# Patient Record
Sex: Female | Born: 1965 | Race: White | Hispanic: No | Marital: Married | Smoking: Never smoker
Health system: Southern US, Community
[De-identification: ages and names within clinical notes are randomized; demographics above are authoritative.]

## PROBLEM LIST (undated history)

## (undated) DIAGNOSIS — M797 Fibromyalgia: Secondary | ICD-10-CM

## (undated) DIAGNOSIS — A6920 Lyme disease, unspecified: Secondary | ICD-10-CM

## (undated) DIAGNOSIS — L719 Rosacea, unspecified: Secondary | ICD-10-CM

## (undated) DIAGNOSIS — E039 Hypothyroidism, unspecified: Secondary | ICD-10-CM

## (undated) DIAGNOSIS — Z77120 Contact with and (suspected) exposure to mold (toxic): Secondary | ICD-10-CM

## (undated) DIAGNOSIS — C4491 Basal cell carcinoma of skin, unspecified: Secondary | ICD-10-CM

## (undated) DIAGNOSIS — C801 Malignant (primary) neoplasm, unspecified: Secondary | ICD-10-CM

## (undated) DIAGNOSIS — M810 Age-related osteoporosis without current pathological fracture: Secondary | ICD-10-CM

## (undated) DIAGNOSIS — E739 Lactose intolerance, unspecified: Secondary | ICD-10-CM

## (undated) HISTORY — DX: Contact with and (suspected) exposure to mold (toxic): Z77.120

## (undated) HISTORY — DX: Hypothyroidism, unspecified: E03.9

## (undated) HISTORY — PX: BREAST SURGERY: SHX581

## (undated) HISTORY — PX: MOHS SURGERY: SUR867

## (undated) HISTORY — DX: Basal cell carcinoma of skin, unspecified: C44.91

## (undated) HISTORY — DX: Age-related osteoporosis without current pathological fracture: M81.0

## (undated) HISTORY — DX: Rosacea, unspecified: L71.9

## (undated) HISTORY — DX: Malignant (primary) neoplasm, unspecified: C80.1

## (undated) HISTORY — DX: Lactose intolerance, unspecified: E73.9

## (undated) HISTORY — DX: Lyme disease, unspecified: A69.20

## (undated) HISTORY — DX: Fibromyalgia: M79.7

---

## 2000-05-10 ENCOUNTER — Inpatient Hospital Stay (HOSPITAL_COMMUNITY): Admission: AD | Admit: 2000-05-10 | Discharge: 2000-05-10 | Payer: Self-pay | Admitting: Obstetrics & Gynecology

## 2001-12-10 ENCOUNTER — Ambulatory Visit (HOSPITAL_COMMUNITY): Admission: RE | Admit: 2001-12-10 | Discharge: 2001-12-10 | Payer: Self-pay | Admitting: Gastroenterology

## 2003-11-15 ENCOUNTER — Ambulatory Visit (HOSPITAL_COMMUNITY): Admission: RE | Admit: 2003-11-15 | Discharge: 2003-11-15 | Payer: Self-pay | Admitting: Family Medicine

## 2003-12-24 ENCOUNTER — Ambulatory Visit (HOSPITAL_COMMUNITY): Admission: RE | Admit: 2003-12-24 | Discharge: 2003-12-24 | Payer: Self-pay | Admitting: Family Medicine

## 2007-08-07 HISTORY — PX: BASAL CELL CARCINOMA EXCISION: SHX1214

## 2008-01-28 ENCOUNTER — Other Ambulatory Visit: Admission: RE | Admit: 2008-01-28 | Discharge: 2008-01-28 | Payer: Self-pay | Admitting: Obstetrics and Gynecology

## 2010-12-22 NOTE — Procedures (Signed)
[place]. [facility]  Patient:    STEPHENY, CANAL Visit Number: [id number] MRN: [id number]          Service Type: END Location: ENDO Attending Physician:  Charna Elizabeth Dictated by:   Anselmo Rod, M.D. Proc. Date: 12/11/01 Admit Date:  12/10/2001 Discharge Date: 12/10/2001   CC:         Marcial Pacas E. Earlene Plater, M.D.   Procedure Report  DATE OF BIRTH:  [birth year]  REFERRING PHYSICIAN:  Sheppard Plumber. Earlene Plater, M.D.  PROCEDURE PERFORMED:  Esophagogastroduodenoscopy.  ENDOSCOPIST:  Anselmo Rod, M.D.  INSTRUMENT USED:  Olympus video panendoscope.  INDICATIONS FOR PROCEDURE:  Dysphagia in a 45 year old white female, rule out esophageal stricture, esophagitis, etc.  PREPROCEDURE PREPARATION:  Informed consent was procured from the patient. The patient was fasted for eight hours prior to the procedure.  PREPROCEDURE PHYSICAL:  The patient had stable vital signs.  Neck supple. Chest clear to auscultation.  S1, S2 regular.  Abdomen soft with normal abdominal bowel sounds.  DESCRIPTION OF PROCEDURE:  The patient was placed in left lateral decubitus position and sedated with 100 mg of Demerol and 10 mg of Versed intravenously. Once the patient was adequately sedated and maintained on low-flow oxygen and continuous cardiac monitoring, the Olympus video panendoscope was advanced through the mouthpiece, over the tongue, into the esophagus under direct vision.  There were small patchy whitish areas in the distal esophagus close to the Z-line.  These were brushed for Candida.  The rest of the esophagus appeared normal in the proximal portion of the midesophagus.  The entire gastric mucosa and proximal small bowel appeared normal.  There was a small hiatal hernia seen on high retroflexion.  IMPRESSION: 1. Mild patchy erythematous changes in the distal esophagus. 2. Small hiatal hernia. 3. Normal-appearing gastric mucosa in the proximal small  bowel.  RECOMMENDATION: 1. Await brushing results. 2. Proceed with a colonoscopy at this time.  Further recommendation made    thereafter. Dictated by:   Anselmo Rod, M.D. Attending Physician:  Charna Elizabeth DD:  12/11/01 TD:  12/12/01 Job: [id number]

## 2010-12-22 NOTE — Procedures (Signed)
[place]. [facility]  Patient:    Stephanie Foley, Stephanie Foley Visit Number: [id number] MRN: [id number]          Service Type: END Location: ENDO Attending Physician:  Charna Elizabeth Dictated by:   Anselmo Rod, M.D. Proc. Date: 12/11/01 Admit Date:  12/10/2001 Discharge Date: 12/10/2001   CC:         Marcial Pacas E. Earlene Plater, M.D.   Procedure Report  DATE OF BIRTH:  [birth year]  REFERRING PHYSICIAN:  Sheppard Plumber. Earlene Plater, M.D.  PROCEDURE PERFORMED:  Colonoscopy.  ENDOSCOPIST:  Anselmo Rod, M.D.  INSTRUMENT USED:  Olympus pediatric colonoscope.  INDICATIONS FOR PROCEDURE:  Rectal bleeding and a history of anal fissures in a 45 year old white female who is postpartum.  The patient has been on stool softeners, Diltiazem cream and lidocaine for symptomatic relief since February of this year but continues to have rectal pain and rectal bleeding. The procedure is being done to rule out colitis, polyps, etc.  PREPROCEDURE PREPARATION:  Informed consent was procured from the patient. The patient was fasted for eight hours prior to the procedure and prepped with a bottle of magnesium citrate and a gallon of NuLytely the night prior to the procedure.  PREPROCEDURE PHYSICAL:  The patient had stable vital signs.  Neck supple. Chest clear to auscultation.  S1, S2 regular.  Abdomen soft with normal bowel sounds.  DESCRIPTION OF PROCEDURE:  The patient was placed in the left lateral decubitus position and sedated with an additional 20 mg of Demerol and 2 mg of Versed for the colonoscopy.  Once the patient was adequately sedated and maintained on low-flow oxygen and continuous cardiac monitoring, the Olympus video colonoscope was advanced from the rectum to the cecum and terminal ileum without difficulty.  Two small superficial anal fissures were seen on anal inspection.  The rest of the exam up to the terminal ileum was normal.  IMPRESSION: 1. Normal colonoscopy  including normal terminal ileum. 2. Two small superficial anal fissures seen.  RECOMMENDATIONS: 1. Continue Diltiazem cream along with lidocaine gel at 10%.  Lidocaine    gel has been called in to her pharmacy. 2. Sitz baths. 3. High fiber diet has been discussed with her in great detail. 4. Breast feeding is to be held for the next 48 hours. Dictated by:   Anselmo Rod, M.D. Attending Physician:  Charna Elizabeth DD:  12/11/01 TD:  12/12/01 Job: [id number]

## 2013-01-26 ENCOUNTER — Telehealth: Payer: Self-pay | Admitting: Certified Nurse Midwife

## 2013-01-26 NOTE — Telephone Encounter (Signed)
Spoke with pt about DL advice. Pt is planning to see PCP on Wed for a checkup. Pt wondering if she can have them draw blood to check thyroid while she is there and have them fax results to our office?

## 2013-01-26 NOTE — Telephone Encounter (Signed)
Spoke with pt about iron supplement. Advised pt she should try a slow release iron that might be easier on her system. Also pt could try an OTC stool softener like colace. Advised pt to increase her consumption of iron rich foods like spinach and other dark green leafy veggies. Fruits and veggies always help constipation. Pt not sure which kind of iron she takes, but she will look into it. Pt also asking for a good website recommendation from DL about perimenopause that she can refer to. Please advise.

## 2013-01-26 NOTE — Telephone Encounter (Signed)
It may not be low iron that is the problem, sometimes Thyroid changes will mimic perimenopausal changes.  I think she needs OV

## 2013-06-23 ENCOUNTER — Encounter: Payer: Self-pay | Admitting: Certified Nurse Midwife

## 2013-06-24 ENCOUNTER — Telehealth: Payer: Self-pay | Admitting: Certified Nurse Midwife

## 2013-06-24 MED ORDER — VITAMIN D (ERGOCALCIFEROL) 1.25 MG (50000 UNIT) PO CAPS: 50000.0000 [IU] | ORAL_CAPSULE | ORAL | Status: DC

## 2013-06-24 NOTE — Telephone Encounter (Signed)
Refill request for VITAMIN D  Last filled by MD on - 06/24/12 X 1 YEAR Last AEX - 06/24/12 Next AEX - 07/27/13 Last Vitamin D - 06/24/12 = 42 RX sent until AEX.

## 2013-06-24 NOTE — Telephone Encounter (Signed)
Patient needs a refill of Vitamin D prescription Pharmacy [facility] [phone] Had to reschedule her AEX due to work

## 2013-06-25 ENCOUNTER — Ambulatory Visit: Payer: Self-pay | Admitting: Certified Nurse Midwife

## 2013-07-27 ENCOUNTER — Ambulatory Visit (INDEPENDENT_AMBULATORY_CARE_PROVIDER_SITE_OTHER): Payer: BC Managed Care – PPO | Admitting: Certified Nurse Midwife

## 2013-07-27 ENCOUNTER — Encounter: Payer: Self-pay | Admitting: Certified Nurse Midwife

## 2013-07-27 VITALS — BP 110/72 | HR 68 | Resp 16 | Ht 62.75 in | Wt 124.0 lb

## 2013-07-27 DIAGNOSIS — Z01419 Encounter for gynecological examination (general) (routine) without abnormal findings: Secondary | ICD-10-CM

## 2013-07-27 NOTE — Progress Notes (Signed)
47 y.o. [id number] Married Caucasian Fe here for annual exam.  Periods normal, but 28-32 days, no issues. Sees PCP for aex and labs. Plans mammogram in 2015, she promises! Just got back from camping in the [place] with children. No health issues today.  Patient's last menstrual period was 07/06/2013.          Sexually active: yes  The current method of family planning is condoms most of the time.    Exercising: yes  gym,weights & aerobics Smoker:  no  Health Maintenance: Pap:  06-24-12 neg HPV HR neg MMG:  none Colonoscopy:  2003 BMD:   none TDaP:  Unsure of date, but updated Labs: none Self breast exam: done occ   reports that she has never smoked. She does not have any smokeless tobacco history on file. She reports that she does not drink alcohol or use illicit drugs.  Past Medical History  Diagnosis Date  . Rosacea   . Cancer     basal cell, different areas of body  . Fibromyalgia     Past Surgical History  Procedure Laterality Date  . Basal cell carcinoma excision  2009    times 5  . Mohs surgery      for basal cell (face)  . Breast surgery  1999 or 2000    mastitis    Current Outpatient Prescriptions  Medication Sig Dispense Refill  . Ascorbic Acid (VITAMIN C PO) Take by mouth as needed.      . B Complex Vitamins (B COMPLEX PO) Take by mouth daily.      Marland Kitchen CALCIUM PO Take by mouth 2 (two) times daily.      . cetirizine (ZYRTEC) 10 MG tablet Take 10 mg by mouth daily.      . fluticasone (FLONASE) 50 MCG/ACT nasal spray as needed.      . Multiple Vitamins-Minerals (MULTIVITAMIN PO) Take by mouth daily.      . Vitamin D, Ergocalciferol, (DRISDOL) 50000 UNITS CAPS capsule Take 1 capsule (50,000 Units total) by mouth every 7 (seven) days.  12 capsule  0   No current facility-administered medications for this visit.    Family History  Problem Relation Age of Onset  . Hypertension Mother   . Hypertension Father   . Stroke Father   . Hypertension Brother      ROS:  Pertinent items are noted in HPI.  Otherwise, a comprehensive ROS was negative.  Exam:   BP 110/72  Pulse 68  Resp 16  Ht 5' 2.75" (1.594 m)  Wt 124 lb (56.246 kg)  BMI 22.14 kg/m2  LMP 07/06/2013 Height: 5' 2.75" (159.4 cm)  Ht Readings from Last 3 Encounters:  07/27/13 5' 2.75" (1.594 m)    General appearance: alert, cooperative and appears stated age Head: Normocephalic, without obvious abnormality, atraumatic Neck: no adenopathy, supple, symmetrical, trachea midline and thyroid normal to inspection and palpation and non-palpable Lungs: clear to auscultation bilaterally Breasts: normal appearance, no masses or tenderness, No nipple retraction or dimpling, No nipple discharge or bleeding, No axillary or supraclavicular adenopathy Heart: regular rate and rhythm Abdomen: soft, non-tender; no masses,  no organomegaly Extremities: extremities normal, atraumatic, no cyanosis or edema Skin: Skin color, texture, turgor normal. No rashes or lesions Lymph nodes: Cervical, supraclavicular, and axillary nodes normal. No abnormal inguinal nodes palpated Neurologic: Grossly normal   Pelvic: External genitalia:  no lesions              Urethra:  normal appearing urethra  with no masses, tenderness or lesions              Bartholin's and Skene's: normal                 Vagina: normal appearing vagina with normal color and discharge, no lesions              Cervix: normal non tender              Pap taken: no Bimanual Exam:  Uterus:  normal size, contour, position, consistency, mobility, non-tender and mid position              Adnexa: normal adnexa and no mass, fullness, tenderness               Rectovaginal: Confirms               Anus:  normal sphincter tone, no lesions  A:  Well Woman with normal exam  Contraception condoms  P:   Reviewed health and wellness pertinent to exam  Pap smear as per guidelines   Mammogram stressed importance and stressed having done in  2015 pap smear not taken today  counseled on breast self exam, mammography screening, adequate intake of calcium and vitamin D, diet and exercise return annually or prn   An After Visit Summary was printed and given to the patient.

## 2013-07-27 NOTE — Patient Instructions (Signed)

## 2013-07-29 NOTE — Progress Notes (Signed)
Reviewed personally.  M. Suzanne Shneur [place], MD.  

## 2013-10-12 ENCOUNTER — Other Ambulatory Visit: Payer: Self-pay | Admitting: Certified Nurse Midwife

## 2013-10-12 NOTE — Telephone Encounter (Signed)
Last AEX 07/27/13 Last refill 06/24/2013 #12/0 refills Next appt 08/11/2014 Last Vitamin D - 06/24/12 = 42  Please approve or deny rx.

## 2013-10-26 ENCOUNTER — Telehealth: Payer: Self-pay | Admitting: Certified Nurse Midwife

## 2013-10-26 ENCOUNTER — Other Ambulatory Visit: Payer: Self-pay | Admitting: Orthopedic Surgery

## 2013-10-26 NOTE — Telephone Encounter (Signed)
Once every 2 weeks

## 2013-10-26 NOTE — Telephone Encounter (Signed)
Spoke with patient. Patient states she has been taking Vitamin D once a week due to hair loss. Prescription now reads to take once every two weeks. Debbi would you like her to continue with once every week or every two weeks?

## 2013-10-26 NOTE — Telephone Encounter (Signed)
Patient is calling about vitamin d said that the last thing  debbi told her was to take the vitamin d once a week but it is listed at once every 14 days. Can you please advise? If it is once a week rx needs to be corrected

## 2013-10-26 NOTE — Telephone Encounter (Signed)
LMTCB.   (Most recent Rx for Vit D was for every 14 days, as of 10-12-13 by DL. No more recent phone note instructing otherwise.)

## 2013-10-27 NOTE — Telephone Encounter (Signed)
LMTCB

## 2013-10-27 NOTE — Telephone Encounter (Signed)
Spoke with pt to advise her to take Vit D once every 2 weeks. Pt agreeable.

## 2013-11-04 NOTE — Telephone Encounter (Signed)
Patient is asking to talk with Jackelyn Poling directly regarding her vitamin D. Patient is asking id Jackelyn Poling in unavailable to cal her she will talk to her nurse.

## 2013-11-04 NOTE — Telephone Encounter (Signed)
Unable to reach patient.

## 2013-11-05 NOTE — Telephone Encounter (Signed)
LMTCB

## 2013-12-14 ENCOUNTER — Telehealth: Payer: Self-pay | Admitting: Certified Nurse Midwife

## 2013-12-14 NOTE — Telephone Encounter (Signed)
Patient will need to come in for Vitamin D level assessment before will change. She can try stopping her Rx and taking daily 1000 IU which some patients are doing better with the daily dosage. Advise what she decides.

## 2013-12-14 NOTE — Telephone Encounter (Signed)
Patient says that she thinks that taking th vitamin d every two weeks is still not enough. Says she is still having fibromyalgia symptoms

## 2013-12-14 NOTE — Telephone Encounter (Signed)
Spoke with patient. Patient states that she was placed on Vitamin D a long time ago by Regina Eck CNM. Was taking it weekly due to fibromyalgia symptoms but after third child was changed to every two weeks. Has been going back and forth between weekly to every two weeks for a while. Patient is currently taking Vitamin D every two weeks. Patient states "I am having months of feeling bad. I am hurting all over, I am weak, and I don't feel well after resting. One day I took my kids to school and came home and got into bed with chills and aches. I used three blankets and took advil and tylenol but I was still hurting after two hours." Patient requesting that to increase dose of Vitamin D to every week instead of every two. Advised I would send a message to [place] regarding dosage increase and would give patient a call back with further instructions and advice. Patient agreeable.

## 2013-12-15 NOTE — Telephone Encounter (Signed)
Spoke with patient. Message from [place] given as seen below. Patient would like to try 1000 IU of Vitamin D daily to see how this works for her. If symptoms are not relieved will call back to schedule office visit for Vitamin D level assessment.   Routing to provider for final review. Patient agreeable to disposition. Will close encounter

## 2014-06-07 ENCOUNTER — Encounter: Payer: Self-pay | Admitting: Certified Nurse Midwife

## 2014-07-15 ENCOUNTER — Other Ambulatory Visit: Payer: Self-pay

## 2014-07-15 DIAGNOSIS — Z1231 Encounter for screening mammogram for malignant neoplasm of breast: Secondary | ICD-10-CM

## 2014-08-10 ENCOUNTER — Telehealth: Payer: Self-pay | Admitting: Certified Nurse Midwife

## 2014-08-10 NOTE — Telephone Encounter (Signed)
Left message regarding upcoming appointment has been canceled and needs to be rescheduled. °

## 2014-08-11 ENCOUNTER — Ambulatory Visit: Payer: BC Managed Care – PPO | Admitting: Certified Nurse Midwife

## 2015-03-10 ENCOUNTER — Telehealth: Payer: Self-pay

## 2015-03-10 NOTE — Telephone Encounter (Signed)
Verified patient remaining in MMG recall.  Routing to provider for final review. Patient agreeable to disposition. Will close encounter.

## 2015-03-10 NOTE — Telephone Encounter (Signed)
Remain in recall

## 2015-03-10 NOTE — Telephone Encounter (Signed)
Spoke with patient. Advised we received a letter from [facility] stating they have been unable to reach her regarding scheduling a 6 month follow up mammogram. Patient states "I just did not feel it was needed." Advised this follow up is highly recommended by [facility] and by Regina Eck CNM. Patient is agreeable. Offered to call and schedule follow up appointment for patient but patient declines. Patient states she would like to call and schedule this appointment. Advised if she would like my assistance may return call to office. Patient is agreeable.

## 2015-04-14 ENCOUNTER — Telehealth: Payer: Self-pay | Admitting: *Deleted

## 2015-04-14 NOTE — Telephone Encounter (Signed)
I reviewed the results and due to possibly complicated appearance report recommended 6 month follow up with [place] . Let her know I reviewed the results and this needs to be done.

## 2015-04-14 NOTE — Telephone Encounter (Signed)
Patient is due for 32-month Left MMG and Ultrasound Recall  Last MMG:  07/15/14 with diagnostic left mammogram and ultrasound.  Impression:  Cysts in left breast likely represent complicated cyst and probably benign.  Recommend left mammogram and ultrasound in 6 months.  Pt overdue for mammogram recall.  Due June 2016.  Follow up appointment has not been made per Janett Billow at [place].  Please call patient to schedule.  [place].

## 2015-04-14 NOTE — Telephone Encounter (Signed)
Pt states she was told it was only recommended & that she didn't really have to have a f/u done there. Please advise.

## 2015-04-14 NOTE — Telephone Encounter (Signed)
Pt states she will call & get this follow up done.

## 2015-04-21 ENCOUNTER — Encounter: Payer: Self-pay | Admitting: *Deleted

## 2015-04-21 NOTE — Telephone Encounter (Signed)
Pt has not scheduled follow up breast imaging.  This is verified with [facility] today. Letter created.  Please advise recall.

## 2015-04-21 NOTE — Telephone Encounter (Signed)
OK to send letter and remove from recall. 

## 2015-04-25 NOTE — Telephone Encounter (Signed)
Letter marked as sent and mailed.  Patient removed from current recall.  Closing encounter.

## 2015-05-16 ENCOUNTER — Telehealth: Payer: Self-pay | Admitting: Certified Nurse Midwife

## 2015-05-16 NOTE — Telephone Encounter (Signed)
Patient returned call. She is agreeable to diagnostic imaging of L breast now and then back to screening bilateral breasts in December.  Advised I will contact her when order is sent. Patient agreeable.  Marland Kitchen

## 2015-05-16 NOTE — Telephone Encounter (Signed)
Patient had screening mammogram 07/2014. Needs L Breast Diagnostic mammogram and L Breast Ultrasound, then was in recall for 6 months repeat imaging to be completed in June 2016.   Message left to return call to [place] at [phone].   Patient will need diagnostic L Breast mammogram and L Breast Ultrasound and then screening again in December 2016.

## 2015-05-16 NOTE — Telephone Encounter (Signed)
Patient says that Ms. Debbie wants her to get a mammogram, patient needs to know if it is screening or diagnostic. Patient says she will also need a order faxed to [facility]. Best contact [phone]

## 2015-05-18 NOTE — Telephone Encounter (Signed)
Spoke with patient and advised order sent. She can call at her [employer]  To schedule. Patient agreeable.  Routing to provider for final review. Patient agreeable to disposition. Will close encounter.

## 2016-08-06 DIAGNOSIS — F0781 Postconcussional syndrome: Secondary | ICD-10-CM

## 2016-08-06 HISTORY — DX: Postconcussional syndrome: F07.81

## 2016-09-18 ENCOUNTER — Ambulatory Visit (HOSPITAL_COMMUNITY)
Admission: EM | Admit: 2016-09-18 | Discharge: 2016-09-18 | Disposition: A | Discharge status: Home / Self Care | Payer: Self-pay | Attending: Family Medicine | Admitting: Family Medicine

## 2016-09-18 ENCOUNTER — Encounter (HOSPITAL_COMMUNITY): Payer: Self-pay | Admitting: Emergency Medicine

## 2016-09-18 DIAGNOSIS — M542 Cervicalgia: Secondary | ICD-10-CM

## 2016-09-18 DIAGNOSIS — R079 Chest pain, unspecified: Secondary | ICD-10-CM

## 2016-09-18 MED ORDER — CYCLOBENZAPRINE HCL 5 MG PO TABS: 5.0000 mg | ORAL_TABLET | Freq: Three times a day (TID) | ORAL | 0 refills | Status: DC

## 2016-09-18 MED ORDER — DICLOFENAC POTASSIUM 50 MG PO TABS: 50.0000 mg | ORAL_TABLET | Freq: Three times a day (TID) | ORAL | 0 refills | Status: DC
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
## 2016-09-18 NOTE — Discharge Instructions (Signed)
Wear collar, use heat and medicine as needed, see your doctor if any problems
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
## 2016-09-25 ENCOUNTER — Ambulatory Visit: Payer: No Typology Code available for payment source | Attending: Sports Medicine | Admitting: Physical Therapy

## 2016-09-25 ENCOUNTER — Encounter: Payer: Self-pay | Admitting: Physical Therapy

## 2016-09-25 DIAGNOSIS — M542 Cervicalgia: Secondary | ICD-10-CM | POA: Diagnosis not present

## 2016-09-25 DIAGNOSIS — M6281 Muscle weakness (generalized): Secondary | ICD-10-CM | POA: Insufficient documentation

## 2016-09-25 DIAGNOSIS — M62838 Other muscle spasm: Secondary | ICD-10-CM | POA: Diagnosis present
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
## 2016-09-26 ENCOUNTER — Ambulatory Visit: Payer: No Typology Code available for payment source | Admitting: Physical Therapy

## 2016-09-26 ENCOUNTER — Encounter: Payer: Self-pay | Admitting: Physical Therapy

## 2016-09-26 DIAGNOSIS — M542 Cervicalgia: Secondary | ICD-10-CM

## 2016-09-26 DIAGNOSIS — M62838 Other muscle spasm: Secondary | ICD-10-CM

## 2016-09-26 DIAGNOSIS — M6281 Muscle weakness (generalized): Secondary | ICD-10-CM
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
## 2016-09-27 ENCOUNTER — Ambulatory Visit: Payer: No Typology Code available for payment source | Admitting: Physical Therapy

## 2016-09-27 DIAGNOSIS — M542 Cervicalgia: Secondary | ICD-10-CM

## 2016-09-27 DIAGNOSIS — M6281 Muscle weakness (generalized): Secondary | ICD-10-CM

## 2016-09-27 DIAGNOSIS — M62838 Other muscle spasm: Secondary | ICD-10-CM

## 2016-09-27 NOTE — Patient Instructions (Signed)
St[facility] [address], [address] [place], [place] [address] Phone # [phone] Fax [phone]    
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
## 2016-10-01 ENCOUNTER — Ambulatory Visit: Payer: No Typology Code available for payment source | Admitting: Physical Therapy

## 2016-10-01 ENCOUNTER — Encounter: Payer: Self-pay | Admitting: Physical Therapy

## 2016-10-01 DIAGNOSIS — M542 Cervicalgia: Secondary | ICD-10-CM

## 2016-10-01 DIAGNOSIS — M62838 Other muscle spasm: Secondary | ICD-10-CM

## 2016-10-01 DIAGNOSIS — M6281 Muscle weakness (generalized): Secondary | ICD-10-CM
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
## 2016-10-03 ENCOUNTER — Encounter: Payer: Self-pay | Admitting: Physical Therapy

## 2016-10-03 ENCOUNTER — Ambulatory Visit: Payer: No Typology Code available for payment source | Admitting: Physical Therapy

## 2016-10-03 DIAGNOSIS — M6281 Muscle weakness (generalized): Secondary | ICD-10-CM

## 2016-10-03 DIAGNOSIS — M542 Cervicalgia: Secondary | ICD-10-CM | POA: Diagnosis not present

## 2016-10-03 DIAGNOSIS — M62838 Other muscle spasm: Secondary | ICD-10-CM
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
## 2016-10-05 ENCOUNTER — Ambulatory Visit: Payer: No Typology Code available for payment source | Attending: Sports Medicine | Admitting: Physical Therapy

## 2016-10-05 DIAGNOSIS — M542 Cervicalgia: Secondary | ICD-10-CM | POA: Insufficient documentation

## 2016-10-05 DIAGNOSIS — M6281 Muscle weakness (generalized): Secondary | ICD-10-CM

## 2016-10-05 DIAGNOSIS — M62838 Other muscle spasm: Secondary | ICD-10-CM

## 2016-10-05 NOTE — Patient Instructions (Signed)
   Levator Stretch   Grasp seat or sit on hand on side to be stretched. Turn head toward other side and look down. Use hand on head to gently stretch neck in that position. Hold _20___ seconds. Repeat on other side. Repeat __3__ times. Do __2__ sessions per day.  http://[email or web address].exer.[email or web address]   Copyright  VHI. All rights reserved.  Side-Bending   One hand on opposite side of head, pull head to side as far as is comfortable. Stop if there is pain. Hold _20__ seconds. Repeat with other hand to other side. Repeat ___3_ times. Do ___2_ sessions per day.   Copyright  VHI. All rights reserved.  Scapular Retraction (Standing)   With arms at sides, pinch shoulder blades together. Repeat _10___ times per set. Do __1__ sets per session. Do __2__ sessions per day.  [email or web address] [address], [place], [place] [address] Phone # [phone] Fax [phone]

## 2016-10-05 NOTE — Therapy (Signed)
[name] [address] [name], [name], [address] Phone: [phone]   Fax:  [phone]  Physical Therapy Treatment  Patient Details  Name: Stephanie Foley MRN: [id number] Date of Birth: 1965-09-02 Referring Provider: Dr. Druscilla Brownie  Encounter Date: 10/05/2016      PT End of Session - 10/05/16 1009    Visit Number 6   Number of Visits 20   Date for PT Re-Evaluation 11/20/16   Authorization Type 20 visit limit   PT Start Time 0938   PT Stop Time 1037   PT Time Calculation (min) 59 min   Activity Tolerance Patient tolerated treatment well      Past Medical History:  Diagnosis Date  . Cancer ([place])    basal cell, different areas of body  . Fibromyalgia   . Rosacea     Past Surgical History:  Procedure Laterality Date  . BASAL CELL CARCINOMA EXCISION  2009   times 5  . BREAST SURGERY  1999 or 2000   mastitis  . MOHS SURGERY     for basal cell (face)    There were no vitals filed for this visit.      Subjective Assessment - 10/05/16 0938    Subjective My main concern is the concussion stuff.  Some anterior chest pain which patient attributes to working on computer yesterday.  Had a good soreness after last visit.  My jaw has been better this week.  Shoulders/chest achiness    Currently in Pain? Yes   Pain Score 1    Pain Location Shoulder  chest                         [facility] Adult PT Treatment/Exercise - 10/05/16 0001      Therapeutic Activites    Therapeutic Activities ADL's;Work Simulation   ADL's postural alignment   Work Simulation sitting, standing, walking     Neck Exercises: Standing   Other Standing Exercises against wall for postural cues   Other Standing Exercises scap retraction 10x     Neck Exercises: Supine   Other Supine Exercise foam roll melt method circles, nods 6x   Other Supine Exercise lying on vertical foam roll with palms up and arms overhead 3  min     Neck Exercises: Sidelying   Other Sidelying Exercise foam roll circles right/left     Moist Heat Therapy   Number Minutes Moist Heat 15 Minutes   Moist Heat Location Cervical  extra layers secondary to heat sensitivity on chest     Electrical Stimulation   Electrical Stimulation Location cervical  and anterior shoulder/chest pec region   Electrical Stimulation Action pre-mod   Electrical Stimulation Parameters to tolerance   Electrical Stimulation Goals Pain     Neck Exercises: Stretches   Upper Trapezius Stretch 2 reps;20 seconds   Levator Stretch 2 reps;20 seconds   Corner Stretch 2 reps;20 seconds                PT Education - 10/05/16 0959    Education provided Yes   Education Details neck/chest stretches for postural correction   Person(s) Educated Patient   Methods Explanation;Demonstration;Handout   Comprehension Verbalized understanding;Returned demonstration          PT Short Term Goals - 10/05/16 1032      PT SHORT TERM GOAL #1   Title independent with initial HEP   Status Achieved     PT  SHORT TERM GOAL #2   Title pain with daily activities decreased >/= 25%   Period Weeks   Status On-going     PT SHORT TERM GOAL #3   Title understand correct posture to decrease strain on cervical and Jaw   Time 4   Period Weeks   Status On-going     PT SHORT TERM GOAL #4   Title ability to eat with pain decreased >/= due to decreased muscle tightness   Time 4   Period Weeks   Status On-going     PT SHORT TERM GOAL #5   Title pain is intermittent due to increased tolerance to activity   Time 4   Period Weeks   Status On-going           PT Long Term Goals - 10/05/16 1033      PT LONG TERM GOAL #1   Title independent with HEP   Time 8   Period Weeks   Status On-going     PT LONG TERM GOAL #2   Title cervical pain with daily activities decresaed >/= 75%   Time 8   Period Weeks   Status On-going     PT LONG TERM GOAL #3   Title  able to eat with pain decreased >/= 75% due to reduction in muscle tightness   Time 8   Period Weeks   Status On-going     PT LONG TERM GOAL #4   Title able to return to work due to pain decreased >/= 75% and concussion symptoms are none to minimal   Time 8   Period Weeks   Status On-going     PT LONG TERM GOAL #5   Title fOTO score </= 25% limitation   Time 8   Period Weeks   Status On-going     PT LONG TERM GOAL #6   Title carrying object and books with 75% greater ease due to increased strength and endurance   Time 8   Period Weeks   Status On-going               Plan - 10/05/16 1026    Clinical Impression Statement 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  She asks about stretches she can do during this time and she was instructed in gentle ROM without overpressure as well as postural considerations and change of position.  She reports heaviness in her head/muscular fatigue following treatment session.  Verbal cues for head/ears over shoulders as patient tends to be in head protruded posture particularly in sitting.   Decreased TMJ pain over the last several days.  Good pain relief with e-stim/heat to cervical/chest region.  Patient has not returned to full activity (driving carpool, social outings or full work duties yet.)     PT Next Visit Plan initiate cervical isometrics and/or supine cervical and scapular stabilization exercises;  modalities as needed for pain control;  assess progress toward STGs      Patient will benefit from skilled therapeutic intervention in order to improve the following deficits and impairments:     Visit Diagnosis: Cervicalgia  Other muscle spasm  Muscle weakness (generalized)     Problem List There are no active problems to display for this patient.   [name], PT 10/05/16 10:47 AM Phone: [phone] Fax: [phone]  [name] 10/05/2016, 10:46  AM  [facility] [address], [address]  [place], [name], [address] Phone: [phone]   Fax:  [phone]  Name: Stephanie Foley MRN: [id number] Date of Birth: 11-02-1965

## 2016-10-09 ENCOUNTER — Encounter: Payer: No Typology Code available for payment source | Admitting: Physical Therapy

## 2016-10-10 ENCOUNTER — Encounter: Payer: Self-pay | Admitting: Neurology

## 2016-10-10 ENCOUNTER — Ambulatory Visit (INDEPENDENT_AMBULATORY_CARE_PROVIDER_SITE_OTHER): Payer: No Typology Code available for payment source | Admitting: Neurology

## 2016-10-10 VITALS — BP 118/70 | HR 91 | Ht 63.0 in | Wt 122.4 lb

## 2016-10-10 DIAGNOSIS — F0781 Postconcussional syndrome: Secondary | ICD-10-CM
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
## 2016-10-10 NOTE — Patient Instructions (Signed)
I would like you to limit screen time (including your phone) to 90 minutes daily for next week.   I want you to be active.  Start with walking outside daily donw the street and back and add a little distance daily.  You can start introducing activity slowly and as tolerated.  Try driving, but on local streets to the grocery store (for example).  I would avoid high way driving for now.  You may try yoga as tolerated.   I would ask the physical therapist about any limitations in terms of weights.  In addition to this I recommend......  To help improve COGNITIVE function: Using fish oil/omega 3 that is 1000 mg (or roughly 600 mg EPA/DHA), starting as soon as possible after concussion, take: 3 tabs THREE TIMES a day  for the first 3 days, then (you will smell a little, sory) 3 tabs TWICE DAILY  for the next 3 days, then 3 tabs ONCE DAILY  for the next 10 days    To help reduce HEADACHES: Coenzyme Q10 160mg  ONCE DAILY Riboflavin/Vitamin B2 400mg  ONCE DAILY Magnesium oxide 400mg  ONCE - TWICE DAILY May stop after headaches are resolved.                                                                                               To help with INSOMNIA: Melatonin 3-5mg  AT BEDTIME       Other medicines to help decrease inflammation Alpha Lipoic Acid 100mg  TWICE DAILY Turmeric 500mg  twice daily Iron 65mg  elemental daily Vitamin D 4000 IU daily for 2 weeks then 2000 IU daily thereafter.  Follow up in 4 to 6 weeks.

## 2016-10-11 ENCOUNTER — Ambulatory Visit: Payer: No Typology Code available for payment source | Admitting: Physical Therapy

## 2016-10-11 DIAGNOSIS — M62838 Other muscle spasm: Secondary | ICD-10-CM

## 2016-10-11 DIAGNOSIS — M542 Cervicalgia: Secondary | ICD-10-CM | POA: Diagnosis not present

## 2016-10-11 DIAGNOSIS — M6281 Muscle weakness (generalized): Secondary | ICD-10-CM

## 2016-10-11 NOTE — Therapy (Signed)
[facility] [address] [place], [name], [address] Phone: [phone]   Fax:  [phone]  Physical Therapy Treatment  Patient Details  Name: Stephanie Foley MRN: [id number] Date of Birth: Nov 29, 1965 Referring Provider: Dr. Druscilla Brownie  Encounter Date: 10/11/2016      PT End of Session - 10/11/16 0914    Visit Number 7   Number of Visits 20   Date for PT Re-Evaluation 11/20/16   Authorization Type 20 visit limit   PT Start Time 0847   PT Stop Time 0940   PT Time Calculation (min) 53 min   Activity Tolerance Patient tolerated treatment well      Past Medical History:  Diagnosis Date  . Cancer ([place])    basal cell, different areas of body  . Fibromyalgia   . Rosacea     Past Surgical History:  Procedure Laterality Date  . BASAL CELL CARCINOMA EXCISION  2009   times 5  . BREAST SURGERY  1999 or 2000   mastitis  . MOHS SURGERY     for basal cell (face)    There were no vitals filed for this visit.      Subjective Assessment - 10/11/16 0852    Subjective Ended up working 2 small jobs  this past Monday and the sitting bothers me.    I'm trying to get back to normal life.  Some dizziness over the weekend.  My neck feels achy lower midline.  Less chest soreness.  I saw the neurologist and that was really good.  He told me not to work for 4 more weeks.     Currently in Pain? Yes   Pain Score 2    Pain Location Neck   Pain Type Acute pain   Aggravating Factors  sitting;  stress                         [facility] Adult PT Treatment/Exercise - 10/11/16 0001      Therapeutic Activites    ADL's postural alignment   Work Simulation sitting, standing, walking     Neck Exercises: Seated   Cervical Isometrics Flexion;Extension;Right lateral flexion;Left lateral flexion;Right rotation;Left rotation;5 secs;5 reps     Neck Exercises: Supine   Other Supine Exercise yellow band supine scapular  exercises:  overhead narrow and wide grip, horizontal abduction, sash, external rotation 5x each                PT Education - 10/11/16 0912    Education provided Yes   Education Details neck isometrics;  supine scapular exercises   Person(s) Educated Patient   Methods Explanation;Demonstration;Handout   Comprehension Verbalized understanding;Returned demonstration          PT Short Term Goals - 10/11/16 0917      PT SHORT TERM GOAL #1   Title independent with initial HEP   Status Achieved     PT SHORT TERM GOAL #2   Title pain with daily activities decreased >/= 25%   Status Achieved     PT SHORT TERM GOAL #3   Title understand correct posture to decrease strain on cervical and Jaw   Time 4   Period Weeks   Status On-going     PT SHORT TERM GOAL #4   Title ability to eat with pain decreased >/= due to decreased muscle tightness     PT SHORT TERM GOAL #5   Title pain is intermittent  due to increased tolerance to activity   Status Achieved           PT Long Term Goals - 10/11/16 0934      PT LONG TERM GOAL #1   Title independent with HEP   Time 8   Period Weeks   Status On-going     PT LONG TERM GOAL #2   Title cervical pain with daily activities decresaed >/= 75%   Time 8   Period Weeks   Status On-going     PT LONG TERM GOAL #3   Title able to eat with pain decreased >/= 75% due to reduction in muscle tightness   Period Weeks   Status On-going     PT LONG TERM GOAL #4   Title able to return to work due to pain decreased >/= 75% and concussion symptoms are none to minimal   Time 8   Period Weeks   Status On-going     PT LONG TERM GOAL #5   Title fOTO score </= 25% limitation   Time 8   Period Weeks   Status On-going     PT LONG TERM GOAL #6   Title carrying object and books with 75% greater ease due to increased strength and endurance   Time 8   Period Weeks   Status On-going               Plan - 10/11/16 0915     Clinical Impression Statement The patient's primary complaint is lower central neck pain today.  She reports mild cervical muscular discomfort with exercise but no headache, jaw pain or chest pain.  Back to 35% of usual ADLS.  Hasn't tried grocery shopping.  Driving a little .  Able to eat without pain and ate almonds.  Short term goals met.     PT Next Visit Plan review cervical isometrics as need and supine scapular ex's;  modalities as needed;  manual as needed      Patient will benefit from skilled therapeutic intervention in order to improve the following deficits and impairments:     Visit Diagnosis: Cervicalgia  Other muscle spasm  Muscle weakness (generalized)     Problem List There are no active problems to display for this patient.   [name] 10/11/2016, 9:56 AM  [facility] [address], [place], [name], [address] Phone: [phone]   Fax:  [phone]  Name: Stephanie Foley MRN: [id number] Date of Birth: [birth year]

## 2016-10-11 NOTE — Therapy (Signed)
[facility] [address] [place], [place], [address] Phone: [phone]   Fax:  [phone]  Physical Therapy Treatment  Patient Details  Name: Stephanie Foley MRN: [id number] Date of Birth: 02/25/1966 Referring Provider: Dr. Valetta Fuller  Encounter Date: 10/11/2016      PT End of Session - 10/11/16 0914    Visit Number 7   Number of Visits 20   Date for PT Re-Evaluation 11/20/16   Authorization Type 20 visit limit   PT Start Time 0847   PT Stop Time 0940   PT Time Calculation (min) 53 min   Activity Tolerance Patient tolerated treatment well      Past Medical History:  Diagnosis Date  . Cancer ([facility])    basal cell, different areas of body  . Fibromyalgia   . Rosacea     Past Surgical History:  Procedure Laterality Date  . BASAL CELL CARCINOMA EXCISION  2009   times 5  . BREAST SURGERY  1999 or 2000   mastitis  . MOHS SURGERY     for basal cell (face)    There were no vitals filed for this visit.      Subjective Assessment - 10/11/16 0852    Subjective Ended up working 2 small jobs  this past Monday and the sitting bothers me.    I'm trying to get back to normal life.  Some dizziness over the weekend.  My neck feels achy lower midline.  Less chest soreness.  I saw the neurologist and that was really good.  He told me not to work for 4 more weeks.     Currently in Pain? Yes   Pain Score 2    Pain Location Neck   Pain Type Acute pain   Aggravating Factors  sitting;  stress                         [facility] Adult PT Treatment/Exercise - 10/11/16 0001      Therapeutic Activites    ADL's postural alignment   Work Simulation sitting, standing, walking     Neck Exercises: Seated   Cervical Isometrics Flexion;Extension;Right lateral flexion;Left lateral flexion;Right rotation;Left rotation;5 secs;5 reps     Neck Exercises: Supine   Other Supine Exercise yellow band supine scapular  exercises:  overhead narrow and wide grip, horizontal abduction, sash, external rotation 5x each     Moist Heat Therapy   Number Minutes Moist Heat 15 Minutes   Moist Heat Location Cervical     Electrical Stimulation   Electrical Stimulation Location cervical  and anterior shoulder/chest pec region   Electrical Stimulation Action IFC   Electrical Stimulation Parameters 4 ma    Electrical Stimulation Goals Pain                PT Education - 10/11/16 0912    Education provided Yes   Education Details neck isometrics;  supine scapular exercises   Person(s) Educated Patient   Methods Explanation;Demonstration;Handout   Comprehension Verbalized understanding;Returned demonstration          PT Short Term Goals - 10/11/16 0917      PT SHORT TERM GOAL #1   Title independent with initial HEP   Status Achieved     PT SHORT TERM GOAL #2   Title pain with daily activities decreased >/= 25%   Status Achieved     PT SHORT TERM GOAL #3   Title understand correct  posture to decrease strain on cervical and Jaw   Time 4   Period Weeks   Status On-going     PT SHORT TERM GOAL #4   Title ability to eat with pain decreased >/= due to decreased muscle tightness     PT SHORT TERM GOAL #5   Title pain is intermittent due to increased tolerance to activity   Status Achieved           PT Long Term Goals - 10/11/16 0934      PT LONG TERM GOAL #1   Title independent with HEP   Time 8   Period Weeks   Status On-going     PT LONG TERM GOAL #2   Title cervical pain with daily activities decresaed >/= 75%   Time 8   Period Weeks   Status On-going     PT LONG TERM GOAL #3   Title able to eat with pain decreased >/= 75% due to reduction in muscle tightness   Period Weeks   Status On-going     PT LONG TERM GOAL #4   Title able to return to work due to pain decreased >/= 75% and concussion symptoms are none to minimal   Time 8   Period Weeks   Status On-going     PT  LONG TERM GOAL #5   Title fOTO score </= 25% limitation   Time 8   Period Weeks   Status On-going     PT LONG TERM GOAL #6   Title carrying object and books with 75% greater ease due to increased strength and endurance   Time 8   Period Weeks   Status On-going               Plan - 10/11/16 0915    Clinical Impression Statement The patient's primary complaint is lower central neck pain today.  She reports mild cervical muscular discomfort with exercise but no headache, jaw pain or chest pain.  Back to 35% of usual ADLS.  Hasn't tried grocery shopping.  Driving a little .  Able to eat without pain and ate almonds.  Short term goals met.     PT Next Visit Plan review cervical isometrics as need and supine scapular ex's;  modalities as needed;  manual as needed      Patient will benefit from skilled therapeutic intervention in order to improve the following deficits and impairments:     Visit Diagnosis: Cervicalgia  Other muscle spasm  Muscle weakness (generalized)     Problem List There are no active problems to display for this patient.  [name], PT 10/11/16 10:05 AM Phone: [phone] Fax: [phone]  [name] 10/11/2016, 10:05 AM  [facility] [address] [place], [name], [address] Phone: [phone]   Fax:  [phone]  Name: Stephanie Foley MRN: [id number] Date of Birth: [birth year]

## 2016-10-11 NOTE — Patient Instructions (Signed)
Submax effort 25% each  Isometric Rotation   Put left index finger on left temple. Gently try to turn head to right, pushing against finger. Hold __5__ seconds. Repeat on the left. Push and release slowly. Repeat __5__ times. Do ___1-2_ sessions per day.  http://gt2.exer.us/24   Copyright  VHI. All rights reserved.  Isometric Lateral Flexion   Put right index finger on right temple. Gently try to move right ear toward shoulder, pushing against finger. Hold __5__ seconds. Repeat on other side. Push and release slowly. Repeat _5___ times. Do __1-2__ sessions per day.  http://[email or web address].exer.us/22   Copyright  VHI. All rights reserved.  Isometric Flexion   Put the tips of both index fingers lightly on forehead. Gently press into fingers as if looking toward ground. Resist for ___5_ seconds. Press and release slowly. Repeat __5__ times. Do __1-2_ sessions per day.  http://gt2.exer.us/18   Copyright  VHI. All rights reserved.  Isometric Extension   Put index fingers gently on back of head. Slowly try to look toward ceiling. Push head into fingers for _5___ seconds. Push and release slowly. Repeat __5__ times. Do _1-2_ sessions per day.  http://gt2.exer.us/20   Copyright  VHI. All rights reserved.    Over Head Pull: Narrow Grip       On back, knees bent, feet flat, band across thighs, elbows straight but relaxed. Pull hands apart (start). Keeping elbows straight, bring arms up and over head, hands toward floor. Keep pull steady on band. Hold momentarily. Return slowly, keeping pull steady, back to start. Repeat __5_ times. Band color _yellow_____   Side Pull: Double Arm   On back, knees bent, feet flat. Arms perpendicular to body, shoulder level, elbows straight but relaxed. Pull arms out to sides, elbows straight. Resistance band comes across collarbones, hands toward floor. Hold momentarily. Slowly return to starting position. Repeat _5__ times. Band color _yellow___    Sash   On back, knees bent, feet flat, left hand on left hip, right hand above left. Pull right arm DIAGONALLY (hip to shoulder) across chest. Bring right arm along head toward floor. Hold momentarily. Slowly return to starting position. Repeat __5_ times. Do with left arm. Band color ____yellow__   Shoulder Rotation: Double Arm   On back, knees bent, feet flat, elbows tucked at sides, bent 90, hands palms up. Pull hands apart and down toward floor, keeping elbows near sides. Hold momentarily. Slowly return to starting position. Repeat _5__ times. Band color _yellow_____     [facility] [address], [place], [place] [address] Phone # [phone] Fax [phone]

## 2016-10-18 ENCOUNTER — Encounter: Payer: Self-pay | Admitting: Physical Therapy

## 2016-10-18 ENCOUNTER — Ambulatory Visit: Payer: No Typology Code available for payment source | Admitting: Physical Therapy

## 2016-10-18 DIAGNOSIS — M542 Cervicalgia: Secondary | ICD-10-CM

## 2016-10-18 DIAGNOSIS — M62838 Other muscle spasm: Secondary | ICD-10-CM

## 2016-10-18 DIAGNOSIS — M6281 Muscle weakness (generalized): Secondary | ICD-10-CM
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
## 2016-10-22 ENCOUNTER — Encounter: Payer: Self-pay | Admitting: Physical Therapy

## 2016-10-22 ENCOUNTER — Ambulatory Visit: Payer: No Typology Code available for payment source | Admitting: Physical Therapy

## 2016-10-22 DIAGNOSIS — M542 Cervicalgia: Secondary | ICD-10-CM | POA: Diagnosis not present

## 2016-10-22 DIAGNOSIS — M6281 Muscle weakness (generalized): Secondary | ICD-10-CM

## 2016-10-22 DIAGNOSIS — M62838 Other muscle spasm: Secondary | ICD-10-CM
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
## 2016-10-23 ENCOUNTER — Encounter: Payer: No Typology Code available for payment source | Admitting: Physical Therapy

## 2016-10-25 ENCOUNTER — Ambulatory Visit: Payer: No Typology Code available for payment source | Admitting: Physical Therapy

## 2016-10-25 ENCOUNTER — Encounter: Payer: Self-pay | Admitting: Physical Therapy

## 2016-10-25 DIAGNOSIS — M542 Cervicalgia: Secondary | ICD-10-CM

## 2016-10-25 DIAGNOSIS — M62838 Other muscle spasm: Secondary | ICD-10-CM

## 2016-10-25 DIAGNOSIS — M6281 Muscle weakness (generalized): Secondary | ICD-10-CM

## 2016-10-25 NOTE — Patient Instructions (Signed)
Flexibility: Neck Retraction    Pull head straight back, keeping eyes and jaw level. Repeat _10___ times per set. Hold for _3-5_ seconds.   http://orth.exer.us/345   Copyright  VHI. All rights reserved.  Shoulder (Scapula) Retraction    Pull shoulders back, squeezing shoulder blades together. Repeat _10___ times per session. Hold for _3-5_ seconds.  Position: Standing   Copyright  VHI. All rights reserved.   * Do all laying down with front chest and neck muscles relaxed.Mikle Bosworth, PTA 10/25/16 9:39 AM  [facility] [address] [place], [place] [address] Phone # [phone] Fax [phone]
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
## 2016-10-29 ENCOUNTER — Ambulatory Visit: Payer: No Typology Code available for payment source | Admitting: Physical Therapy

## 2016-10-29 ENCOUNTER — Encounter: Payer: Self-pay | Admitting: Physical Therapy

## 2016-10-29 DIAGNOSIS — M542 Cervicalgia: Secondary | ICD-10-CM | POA: Diagnosis not present

## 2016-10-29 DIAGNOSIS — M62838 Other muscle spasm: Secondary | ICD-10-CM

## 2016-10-29 DIAGNOSIS — M6281 Muscle weakness (generalized): Secondary | ICD-10-CM
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
## 2016-10-29 NOTE — Patient Instructions (Signed)
Levator Stretch   Grasp seat or sit on hand on side to be stretched. Turn head toward other side and look down. Use hand on head to gently stretch neck in that position. Hold ____ seconds. Repeat on other side. Repeat __2__ times. Do __as needed__ sessions per day. *Looking up; gently*  http://gt2.exer.us/30   Copyright  VHI. All rights reserved.  Side-Bending   One hand on opposite side of head, pull head to side as far as is comfortable. Stop if there is pain. Hold __15__ seconds. Repeat with other hand to other side. Repeat __2__ times. Do _as needed___ sessions per day. *Looking up; gently*   Copyright  VHI. All rights reserved.

## 2016-10-31 ENCOUNTER — Encounter: Payer: Self-pay | Admitting: Physical Therapy

## 2016-10-31 ENCOUNTER — Ambulatory Visit: Payer: No Typology Code available for payment source | Admitting: Physical Therapy

## 2016-10-31 DIAGNOSIS — M542 Cervicalgia: Secondary | ICD-10-CM | POA: Diagnosis not present

## 2016-10-31 DIAGNOSIS — M62838 Other muscle spasm: Secondary | ICD-10-CM

## 2016-10-31 DIAGNOSIS — M6281 Muscle weakness (generalized): Secondary | ICD-10-CM
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
## 2016-11-05 ENCOUNTER — Ambulatory Visit: Payer: No Typology Code available for payment source | Attending: Sports Medicine | Admitting: Physical Therapy

## 2016-11-05 ENCOUNTER — Encounter: Payer: Self-pay | Admitting: Physical Therapy

## 2016-11-05 DIAGNOSIS — M62838 Other muscle spasm: Secondary | ICD-10-CM | POA: Diagnosis present

## 2016-11-05 DIAGNOSIS — M542 Cervicalgia: Secondary | ICD-10-CM | POA: Diagnosis present

## 2016-11-05 DIAGNOSIS — M6281 Muscle weakness (generalized): Secondary | ICD-10-CM | POA: Insufficient documentation
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
## 2016-11-06 ENCOUNTER — Telehealth: Payer: Self-pay | Admitting: Neurology

## 2016-11-06 NOTE — Telephone Encounter (Signed)
PT said she has noticeable hearing loss and would like a call back/Dawn

## 2016-11-06 NOTE — Telephone Encounter (Signed)
Returned call. Left vmail. Advised patient to contact PCP in reference to noticeable hearing loss.

## 2016-11-06 NOTE — Telephone Encounter (Signed)
Pt returned call. Gave advice per previous note. Pt agreed.

## 2016-11-09 ENCOUNTER — Ambulatory Visit: Payer: No Typology Code available for payment source | Admitting: Physical Therapy

## 2016-11-09 ENCOUNTER — Encounter: Payer: Self-pay | Admitting: Physical Therapy

## 2016-11-09 DIAGNOSIS — M62838 Other muscle spasm: Secondary | ICD-10-CM

## 2016-11-09 DIAGNOSIS — M542 Cervicalgia: Secondary | ICD-10-CM

## 2016-11-09 DIAGNOSIS — M6281 Muscle weakness (generalized): Secondary | ICD-10-CM
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
## 2016-11-12 ENCOUNTER — Ambulatory Visit: Payer: No Typology Code available for payment source | Admitting: Physical Therapy

## 2016-11-12 ENCOUNTER — Encounter: Payer: Self-pay | Admitting: Physical Therapy

## 2016-11-12 DIAGNOSIS — M6281 Muscle weakness (generalized): Secondary | ICD-10-CM

## 2016-11-12 DIAGNOSIS — M542 Cervicalgia: Secondary | ICD-10-CM

## 2016-11-12 DIAGNOSIS — M62838 Other muscle spasm: Secondary | ICD-10-CM
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
## 2016-11-14 ENCOUNTER — Encounter: Payer: Self-pay | Admitting: Physical Therapy

## 2016-11-14 ENCOUNTER — Ambulatory Visit: Payer: No Typology Code available for payment source | Admitting: Physical Therapy

## 2016-11-14 DIAGNOSIS — M6281 Muscle weakness (generalized): Secondary | ICD-10-CM

## 2016-11-14 DIAGNOSIS — M62838 Other muscle spasm: Secondary | ICD-10-CM

## 2016-11-14 DIAGNOSIS — M542 Cervicalgia: Secondary | ICD-10-CM | POA: Diagnosis not present
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
## 2016-11-14 NOTE — Patient Instructions (Signed)
Bridge    Lie back, legs bent. Inhale, pressing hips up. Keeping ribs in, lengthen lower back. Exhale, rolling down along spine from top. Repeat __10__ times. Do _2__ sessions per day.  http://pm.exer.us/55   Copyright  VHI. All rights reserved.   Flexibility: Neck Retraction    Pull head straight back, keeping eyes and jaw level. Repeat __10__ times per set. Do __1__ sets per session. Do __1__ sessions per day.  http://orth.exer.us/345   Copyright  VHI. All rights reserved.   Clam Shell 45 Degrees    Lying with hips and knees bent 45, one pillow between knees and ankles. Lift knee. Be sure pelvis does not roll backward. Do not arch back. Do _10__ times, each leg, __1_ times per day.  http://ss.exer.us/75   Copyright  VHI. All rights reserved.   Mikle Bosworth, PTA 11/14/16 9:13 AM  [facility] [address], [place], [place] [address] Phone # [phone] Fax [phone]

## 2016-11-15 ENCOUNTER — Ambulatory Visit (INDEPENDENT_AMBULATORY_CARE_PROVIDER_SITE_OTHER): Payer: No Typology Code available for payment source | Admitting: Neurology

## 2016-11-15 ENCOUNTER — Encounter: Payer: Self-pay | Admitting: Neurology

## 2016-11-15 VITALS — BP 114/64 | HR 70 | Ht 63.0 in | Wt 123.9 lb

## 2016-11-15 DIAGNOSIS — H9190 Unspecified hearing loss, unspecified ear: Secondary | ICD-10-CM

## 2016-11-15 DIAGNOSIS — G43009 Migraine without aura, not intractable, without status migrainosus: Secondary | ICD-10-CM

## 2016-11-15 DIAGNOSIS — F0781 Postconcussional syndrome: Secondary | ICD-10-CM | POA: Diagnosis not present

## 2016-11-15 MED ORDER — NORTRIPTYLINE HCL 10 MG PO CAPS: 10.0000 mg | ORAL_CAPSULE | Freq: Every day | ORAL | 3 refills | Status: DC

## 2016-11-15 MED ORDER — SUMATRIPTAN SUCCINATE 100 MG PO TABS: ORAL_TABLET | ORAL | 2 refills | Status: DC
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
## 2016-11-15 NOTE — Patient Instructions (Signed)
1.  To help prevent headaches, start nortriptyline 10mg  at bedtime.  Contact me in 4 weeks with update and we can increase dose if needed. 2.  Take sumatriptan 100mg  at earliest onset of headache.  May repeat dose once in 2 hours if needed.  Do not exceed 2 tablets in 24 hours. 3.  In addition to magnesium, take riboflavin 400mg  daily, coenzyme Q10 160mg  daily and turmeric 500mg  twice daily 4.  Due to decreased hearing, we will get MRI of brain and refer you to ENT (Dr. Constance Holster) 5.  We will also refer you to the Concussion Clinic. 6  Follow up in 3 months.

## 2016-11-19 ENCOUNTER — Ambulatory Visit: Payer: No Typology Code available for payment source

## 2016-11-19 DIAGNOSIS — M6281 Muscle weakness (generalized): Secondary | ICD-10-CM

## 2016-11-19 DIAGNOSIS — M542 Cervicalgia: Secondary | ICD-10-CM | POA: Diagnosis not present

## 2016-11-19 DIAGNOSIS — M62838 Other muscle spasm: Secondary | ICD-10-CM
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
## 2016-11-20 ENCOUNTER — Ambulatory Visit (INDEPENDENT_AMBULATORY_CARE_PROVIDER_SITE_OTHER): Payer: No Typology Code available for payment source | Admitting: Family Medicine

## 2016-11-20 ENCOUNTER — Encounter: Payer: Self-pay | Admitting: Family Medicine

## 2016-11-20 DIAGNOSIS — F0781 Postconcussional syndrome: Secondary | ICD-10-CM | POA: Diagnosis not present

## 2016-11-20 NOTE — Assessment & Plan Note (Signed)
She does have some signs and symptoms that is consistent with a concussion but the validity of the test seems to be poor. I believe that there is some other underlying conditions including stress as well as other things and is likely contributing to more of the pain. We discussed different over-the-counter medications a could be beneficial. We discussed the possibility of doing more laboratory workup patient declined. Patient is going to try the conservative therapy and we will start increasing patient's work over the course of next 2 weeks. Patient will follow-up at that time as long as patient is improving we will advance accordingly.

## 2016-11-20 NOTE — Patient Instructions (Signed)
Good to see you I do think you have a OTHER THINGS GOING ON KEEPING YOU FROM FULLY HEALING.  No sport until you are back in school with no symptoms.  You may then start the return to play protocol I am giving you.  In addition to this I recommend......  To help improve COGNITIVE function: Using fish oil/omega 3 that is 1000 mg (or roughly 600 mg EPA/DHA),  3 tabs ONCE DAILY  for the next 10 days   To help reduce HEADACHES: Coenzyme Q10 160mg  ONCE DAILY                                                                                          Other medicines to help decrease inflammation Turmeric 500mg  twice daily Vitamin D 2000 IU dialy  Iron 65mg  daily with 500mg  of vitamin C  I want to see you again in 2 WEEKS (OK to double book)

## 2016-11-26 ENCOUNTER — Encounter: Payer: No Typology Code available for payment source | Admitting: Physical Therapy

## 2016-11-28 ENCOUNTER — Telehealth: Payer: Self-pay | Admitting: Neurology

## 2016-11-28 MED ORDER — DIAZEPAM 5 MG PO TABS: 5.0000 mg | ORAL_TABLET | ORAL | 0 refills | Status: DC

## 2016-11-28 NOTE — Telephone Encounter (Signed)
Tried to contact patient regarding what she wanted prior to MRI but unable to contact patient or leave message.

## 2016-11-28 NOTE — Telephone Encounter (Signed)
Valium called into pharmacy.  Left message informing patient.

## 2016-11-28 NOTE — Telephone Encounter (Signed)
VM-PT left a message requesting a prescription of 1 pill be called in before her MRI on Friday/Dawn

## 2016-11-28 NOTE — Telephone Encounter (Signed)
We can prescribe her Valium 5mg  to take 15 to 20 minutes prior to MRI.  She will need somebody to drive her to and from the test.

## 2016-11-30 ENCOUNTER — Ambulatory Visit: Payer: No Typology Code available for payment source | Admitting: Physical Therapy

## 2016-11-30 ENCOUNTER — Ambulatory Visit
Admission: RE | Admit: 2016-11-30 | Discharge: 2016-11-30 | Disposition: A | Discharge status: Home / Self Care | Payer: Self-pay | Source: Ambulatory Visit | Attending: Neurology | Admitting: Neurology

## 2016-11-30 ENCOUNTER — Other Ambulatory Visit: Payer: No Typology Code available for payment source

## 2016-11-30 DIAGNOSIS — F0781 Postconcussional syndrome: Secondary | ICD-10-CM

## 2016-11-30 DIAGNOSIS — G43009 Migraine without aura, not intractable, without status migrainosus: Secondary | ICD-10-CM

## 2016-11-30 DIAGNOSIS — H9190 Unspecified hearing loss, unspecified ear: Secondary | ICD-10-CM

## 2016-11-30 MED ORDER — GADOXETATE DISODIUM 0.25 MMOL/ML IV SOLN
10.0000 mL | Freq: Once | INTRAVENOUS | Status: AC | PRN
  Administered 2016-11-30: 7 mL via INTRAVENOUS

## 2016-12-03 ENCOUNTER — Ambulatory Visit: Payer: No Typology Code available for payment source | Admitting: Family Medicine

## 2016-12-04 ENCOUNTER — Telehealth: Payer: Self-pay

## 2016-12-04 ENCOUNTER — Ambulatory Visit: Payer: No Typology Code available for payment source | Admitting: Neurology

## 2016-12-04 NOTE — Telephone Encounter (Signed)
Informed patient of MRI results.

## 2016-12-04 NOTE — Telephone Encounter (Signed)
-----   Message from Pieter Partridge, DO sent at 12/03/2016  6:59 AM EDT ----- MRI of brain looks unremarkable.  There is nothing concerning.

## 2016-12-06 ENCOUNTER — Ambulatory Visit: Payer: Self-pay | Admitting: Family Medicine

## 2016-12-11 NOTE — Progress Notes (Signed)
Subjective:   @[name] @  Chief Complaint: Stephanie Foley, DOB: 1965/10/30, is a 51 y.o. female who presents for follow-up for a head injury. She went on a school trip with her child which she said went well. She did not have an increase in her symptoms. 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 Her work activities exhaust her and she also becomes emotional. Patient is feeling fatigued today but she did not sleep well as she was up coughing all night due to contracting laryngitis.    Injury date : 2 Visit #: 2  History of Present Illness:   Concussion Self-Reported Symptom Score Symptoms rated on a scale 1-6, in last 24 hours  Headache: 0  Nausea: 0  Vomiting: 0  Balance Difficulty: 0   Dizziness: 0  Fatigue: 2  Trouble Falling Asleep: 0   Sleep More Than Usual: 0  Sleep Less Than Usual: 0  Daytime Drowsiness: 0  Photophobia:3  Phonophobia: 1  Irritability: 1  Sadness: 2  Nervousness: 0  Feeling More Emotional: 5  Numbness or Tingling: 0  Feeling Slowed Down: 2  Feeling Mentally Foggy: 1  Difficulty Concentrating: 0  Difficulty Remembering: 0  Visual Problems: 0    Total Symptom Score: 17   Review of Systems: Pertinent items are noted in HPI.  Review of History: Past Medical History: @PMHP @  Past Surgical History:  has a past surgical history that includes Excision basal cell carcinoma (2009); Mohs surgery; and Breast surgery (1999 or 2000). Family History: family history includes Hypertension in her brother, father, and mother; Stroke in her father. Social History:  reports that she has never smoked. She has never used smokeless tobacco. She reports that she does not drink alcohol or use drugs. Current Medications: has a current medication list which includes the following prescription(s): ascorbic acid, b complex vitamins, calcium, cetirizine, diazepam, fluticasone, multiple vitamins-minerals,  nortriptyline, omega-3 fatty acids, sumatriptan, and vitamin d (ergocalciferol). Allergies: is allergic to amoxicillin.  Objective:    Physical Examination There were no vitals filed for this visit. General appearance: alert, appears stated age and cooperative Head: Normocephalic, without obvious abnormality, atraumatic Eyes: conjunctivae/corneas clear. PERRL, EOM's intact. Fundi benign. Sclera anicteric. Lungs: clear to auscultation bilaterally and percussion Heart: regular rate and rhythm, S1, S2 normal, no murmur, click, rub or gallop Neurologic: CN 2-12 normal.  Sensation to pain, touch, and proprioception normal.  DTRs  normal in upper and lower extremities. No pathologic reflexes. Neg rhomberg, modified rhomberg, pronator drift, tandem gait, finger-to-nose; see post-concussion vestibular and oculomotor testing in chart Psychiatric: Oriented X3, intact recent and remote memory, judgement and insight, normal mood and affect  Concussion testing performed today:  Neurocognitive testing (ImPACT):   Post #2: 2   Verbal Memory Composite  80 (41%)   Visual Memory Composite  58 (32%)   Visual Motor Speed Composite  31.58 (32%)   Reaction Time Composite  .74 (26%)   Cognitive Efficiency Index  .23       Additional testing performed today:    Assessment:    No diagnosis found.  Sabino Gasser presents with the following concussion subtypes. [] Cognitive [] Cervical [] Vestibular [] Ocular [] Migraine [] Anxiety/Mood   Plan:   Action/Discussion: Reviewed diagnosis, management options, expected outcomes, and the reasons for scheduled and emergent follow-up. Questions were adequately answered. Patient expressed verbal understanding and agreement with the following plan.       I was personally involved with the  physical evaluation of and am in agreement with the assessment and treatment plan for this patient.  Greater than 50% of this encounter was spent in direct consultation with  the patient in evaluation, counseling, and coordination of care. Duration of encounter: 50 minutes.  After Visit Summary printed out and provided to patient as appropriate.  This note is written by Lyndal Pulley, in the presence of and acting as the scribe of Lyndal Pulley, DO.

## 2016-12-12 ENCOUNTER — Ambulatory Visit (INDEPENDENT_AMBULATORY_CARE_PROVIDER_SITE_OTHER): Payer: No Typology Code available for payment source | Admitting: Family Medicine

## 2016-12-12 ENCOUNTER — Encounter: Payer: Self-pay | Admitting: Family Medicine

## 2016-12-12 DIAGNOSIS — F0781 Postconcussional syndrome: Secondary | ICD-10-CM

## 2016-12-12 NOTE — Assessment & Plan Note (Signed)
Discussed at length, making improvement. Slow improvement. Discussed the over-the-counter meds.  Declined labs Discussed increasing Physical activity  F/u again in 2 weeks.

## 2016-12-12 NOTE — Patient Instructions (Signed)
good to see you  I want to get you move and back to your routine The cold is playing a role.  Continue the fish oil  Vitamin D and iron.  Can workout if you want  Avoid direct sunlight for another week at least  See me again in 3 weeks.

## 2016-12-13 ENCOUNTER — Ambulatory Visit: Payer: No Typology Code available for payment source | Attending: Sports Medicine | Admitting: Physical Therapy

## 2016-12-13 DIAGNOSIS — M6281 Muscle weakness (generalized): Secondary | ICD-10-CM | POA: Diagnosis present

## 2016-12-13 DIAGNOSIS — M542 Cervicalgia: Secondary | ICD-10-CM | POA: Insufficient documentation

## 2016-12-13 DIAGNOSIS — M62838 Other muscle spasm: Secondary | ICD-10-CM | POA: Diagnosis present

## 2016-12-13 NOTE — Patient Instructions (Signed)
      Child's Pose  Cat/Camel  Stand with back to the wall to check posture  [name] PT [facility] [address], [place], [place] [address] Phone # [phone] Fax [phone]
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
## 2016-12-18 ENCOUNTER — Ambulatory Visit: Payer: No Typology Code available for payment source | Admitting: Physical Therapy

## 2016-12-18 DIAGNOSIS — M542 Cervicalgia: Secondary | ICD-10-CM | POA: Diagnosis not present

## 2016-12-18 DIAGNOSIS — M62838 Other muscle spasm: Secondary | ICD-10-CM

## 2016-12-18 DIAGNOSIS — M6281 Muscle weakness (generalized): Secondary | ICD-10-CM

## 2016-12-18 NOTE — Patient Instructions (Addendum)
    [facility]  [facility] with Raeford Razor  PT  [phone]  (Sessions on Wednesdays)     Bracing With Arm Raise (Quadruped)  On hands and knees find neutral spine. Tighten pelvic floor and abdominals and hold. Alternately lift arm to shoulder level. Repeat __5_ times. Do 1___ times a day.  Quadruped Alternate Hip Extension   Shift weight to one side and raise opposite leg. Keep trunk steady. _5__ reps per set, __1_ sets per day, __7_ days per week Repeat with other leg.  Bracing With Arm / Leg Raise (Quadruped)  On hands and knees find neutral spine. Tighten pelvic floor and abdominals and hold. Alternating, lift arm to shoulder level and opposite leg to hip level. Repeat _5__ times. Do _1__ times a day.    Copyright  VHI. All rights reserved.
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
## 2016-12-28 ENCOUNTER — Ambulatory Visit: Payer: No Typology Code available for payment source | Admitting: Physical Therapy

## 2016-12-28 DIAGNOSIS — M542 Cervicalgia: Secondary | ICD-10-CM | POA: Diagnosis not present

## 2016-12-28 DIAGNOSIS — M62838 Other muscle spasm: Secondary | ICD-10-CM

## 2016-12-28 DIAGNOSIS — M6281 Muscle weakness (generalized): Secondary | ICD-10-CM

## 2016-12-28 NOTE — Therapy (Signed)
[facility] [address] [place], [name], [address] Phone: [phone]   Fax:  [phone]  Physical Therapy Treatment/Discharge Summary  Patient Details  Name: Stephanie Foley MRN: [id number] Date of Birth: 07/26/1966 Referring Provider: Dr. Druscilla Brownie  Encounter Date: 12/28/2016      PT End of Session - 12/28/16 1043    Visit Number 20   Number of Visits 20   Date for PT Re-Evaluation 02/07/17   Authorization Type 20 visit limit   PT Start Time 0849   PT Stop Time 0930   PT Time Calculation (min) 41 min   Activity Tolerance Patient tolerated treatment well      Past Medical History:  Diagnosis Date  . Cancer ([place])    basal cell, different areas of body  . Fibromyalgia   . Rosacea     Past Surgical History:  Procedure Laterality Date  . BASAL CELL CARCINOMA EXCISION  2009   times 5  . BREAST SURGERY  1999 or 2000   mastitis  . MOHS SURGERY     for basal cell (face)    There were no vitals filed for this visit.      Subjective Assessment - 12/28/16 0848    Subjective My lower back is hurting.  If I sit at my dining room table or kitchen table and work that gets things started.   This week I haven't worked as much.  Not as much neck or scapular pain.  No jaw pain unless I have a migraine.  My concussion symptoms have gotten better this week, less intense symptoms and less frequent.  My arms hurt after playing the piano 3 songs,  I haven't played since February.  Eating normally, I can even crunch ice.     Limitations Sitting   Currently in Pain? Yes   Pain Score 4    Pain Location Back   Multiple Pain Sites Yes   Pain Score 0   Pain Location Neck            [facility] PT Assessment - 12/28/16 0001      Observation/Other Assessments   Focus on Therapeutic Outcomes (FOTO)  34% limitation     AROM   Overall AROM Comments open 1 1/2 inches without deviation   Cervical Flexion 50   Cervical Extension 45   Cervical - Right Side Bend 45   Cervical - Left Side Bend 40   Cervical - Right Rotation 60   Cervical - Left Rotation 55     Strength   Overall Strength Comments bil. shoulder flexion and ER 4+/5                     [facility] Adult PT Treatment/Exercise - 12/28/16 0001      Self-Care   Other Self-Care Comments  Use of lumbar roll, sitting limitation     Therapeutic Activites    ADL's postural alignment     Shoulder Exercises: Seated   Other Seated Exercises discussion of 1# weights or band shoulder 3 ways 10x each   Other Seated Exercises discussion of Pilates and community based options                  PT Short Term Goals - 12/28/16 0912      PT SHORT TERM GOAL #1   Title independent with initial HEP   Status Achieved     PT SHORT TERM GOAL #2   Title  pain with daily activities decreased >/= 25%   Status Achieved     PT SHORT TERM GOAL #3   Title understand correct posture to decrease strain on cervical and Jaw   Status Achieved     PT SHORT TERM GOAL #4   Title ability to eat with pain decreased >/= due to decreased muscle tightness   Status Achieved     PT SHORT TERM GOAL #5   Title pain is intermittent due to increased tolerance to activity   Status Achieved           PT Long Term Goals - 12/28/16 0910      PT LONG TERM GOAL #1   Title independent with HEP   Status Achieved     PT LONG TERM GOAL #2   Title cervical pain with daily activities decresaed >/= 75%   Status Achieved     PT LONG TERM GOAL #3   Title able to eat with pain decreased >/= 75% due to reduction in muscle tightness   Status Achieved     PT LONG TERM GOAL #4   Title able to return to work due to pain decreased >/= 75% and concussion symptoms are none to minimal   Baseline 60%   Status Partially Met     PT LONG TERM GOAL #5   Title fOTO score </= 25% limitation   Status Partially Met     PT LONG TERM GOAL #6   Title carrying  object and books with 75% greater ease due to increased strength and endurance   Status Achieved               Plan - 12/28/16 1044    Clinical Impression Statement The patient has full cervical, shoulder and jaw ROM (without deviation) or pain in these areas.   Shoulder and scapular strength grossly 4/5.  Her FOTO functional outcome score has improved from 44% perceived disability to 34%.  Her primary complaint today is LBP especially with sitting and we discussed strategies for relief including use of a lumbar roll.  She would benefit from a community based ex program and wed discussed several options including Pilates.  She has met the majority of rehab goals.  I anticipate she will continue to make improvements in strength and endurance over time and as her concussion symptoms continue to resolve.  Recommend discharge from PT at this time.         PHYSICAL THERAPY DISCHARGE SUMMARY  Visits from Start of Care: 20  Current functional level related to goals / functional outcomes: See clinical impressions above.     Remaining deficits: As above   Education / Equipment: Comprehensive HEP Plan: Patient agrees to discharge.  Patient goals were partially met. Patient is being discharged due to meeting the stated rehab goals.  ?????      Patient will benefit from skilled therapeutic intervention in order to improve the following deficits and impairments:     Visit Diagnosis: Cervicalgia  Other muscle spasm  Muscle weakness (generalized)     Problem List Patient Active Problem List   Diagnosis Date Noted  . Postconcussive syndrome 11/20/2016   [name], PT 12/28/16 10:54 AM Phone: [phone] Fax: [phone]  [name] 12/28/2016, 10:49 AM  [facility] [address], [place], [name], [address] Phone: [phone]   Fax:  [phone]  Name: Stephanie Foley MRN: [id number] Date of  Birth: Jun 12, 1966

## 2017-01-02 ENCOUNTER — Encounter: Payer: No Typology Code available for payment source | Admitting: Physical Therapy

## 2017-01-03 ENCOUNTER — Ambulatory Visit (INDEPENDENT_AMBULATORY_CARE_PROVIDER_SITE_OTHER): Payer: No Typology Code available for payment source | Admitting: Family Medicine

## 2017-01-03 ENCOUNTER — Encounter: Payer: Self-pay | Admitting: Family Medicine

## 2017-01-03 DIAGNOSIS — F0781 Postconcussional syndrome: Secondary | ICD-10-CM | POA: Diagnosis not present

## 2017-01-03 NOTE — Progress Notes (Signed)
   Subjective:    I, Stephanie Foley, am serving as a scribe for Dr. Hulan Saas, DO. Chief Complaint: Stephanie Foley, DOB: 1965-08-13, is a 51 y.o. female who presents for follow-up for head injury. She was last seen on 5/9 and notes improvement the following week. She has still been experiencing headache, dizziness and light sensitivity with prolonged work but these symptoms have decreased each week since we have seen her. She did host a graduation party for her son which did not provoke any symptoms. She does not have any symptoms today.   Injury date : 09/18/2016 Visit #: 3  History of Present Illness:    Concussion Self-Reported Symptom Score All symptoms are zero rating.   Previous Symptom Score: 17  Review of Systems: Pertinent items are noted in HPI.  Review of History: Past Medical History: @PMHP @  Past Surgical History:  has a past surgical history that includes Excision basal cell carcinoma (2009); Mohs surgery; and Breast surgery (1999 or 2000). Family History: family history includes Hypertension in her brother, father, and mother; Stroke in her father. Social History:  reports that she has never smoked. She has never used smokeless tobacco. She reports that she does not drink alcohol or use drugs. Current Medications: has a current medication list which includes the following prescription(s): ascorbic acid, b complex vitamins, calcium, cetirizine, diazepam, fluticasone, multiple vitamins-minerals, nortriptyline, omega-3 fatty acids, sumatriptan, and vitamin d (ergocalciferol). Allergies: is allergic to amoxicillin.  Objective:    Physical Examination Vitals:   01/03/17 0831  BP: 100/62  Pulse: 60   General appearance: alert, appears stated age and cooperative Head: Normocephalic, without obvious abnormality, atraumatic Eyes: conjunctivae/corneas clear. PERRL, EOM's intact. Fundi benign. Sclera anicteric. Lungs: clear to auscultation bilaterally and  percussion Heart: regular rate and rhythm, S1, S2 normal, no murmur, click, rub or gallop Neurologic: CN 2-12 normal.  Sensation to pain, touch, and proprioception normal.  DTRs  normal in upper and lower extremities. No pathologic reflexes. Neg rhomberg, modified rhomberg, pronator drift, tandem gait, finger-to-nose; see post-concussion vestibular and oculomotor testing in chart Psychiatric: Oriented X3, intact recent and remote memory, judgement and insight, normal mood and affect  Concussion testing performed today: No testing performed today.      Assessment:    No diagnosis found.  Stephanie Foley presents with the following concussion subtypes. [] Cognitive [] Cervical [] Vestibular [x] Ocular [] Migraine [] Anxiety/Mood   Plan:   Action/Discussion: Reviewed diagnosis, management options, expected outcomes, and the reasons for scheduled and emergent follow-up. Questions were adequately answered. Patient expressed verbal understanding and agreement with the following plan.     After Visit Summary printed out and provided to patient as appropriate.

## 2017-01-03 NOTE — Patient Instructions (Addendum)
Good to see you  I am excited about the decrease in symptoms.  I think it will get better and better Keep doing the iron at least another 2 months then around your period If I can help your son I am here.

## 2017-01-03 NOTE — Assessment & Plan Note (Signed)
I believe the patient's symptoms are nearly completely resolved at this time. I do think the patient is at maximal medical improvement. I do not think that there is any other treatment is necessary. Patient has any worsening symptoms such as back pain she can come back for further evaluation but likely can follow-up as needed.

## 2017-01-14 ENCOUNTER — Other Ambulatory Visit: Payer: Self-pay | Admitting: *Deleted

## 2017-01-14 ENCOUNTER — Telehealth: Payer: Self-pay | Admitting: Neurology

## 2017-01-14 NOTE — Telephone Encounter (Signed)
Called patient back and she said that she does still have refills on her sumatriptan.  She will call pharmacy to fill it.

## 2017-01-14 NOTE — Telephone Encounter (Signed)
PT called and left a voicemail message and said she needs a refill of her headache medicine but could not remember the name of it, it is to be sent to [facility], she said it has no refills

## 2017-02-21 ENCOUNTER — Encounter: Payer: Self-pay | Admitting: Neurology

## 2017-02-21 ENCOUNTER — Ambulatory Visit (INDEPENDENT_AMBULATORY_CARE_PROVIDER_SITE_OTHER): Payer: No Typology Code available for payment source | Admitting: Neurology

## 2017-02-21 VITALS — BP 110/70 | HR 76 | Ht 63.0 in | Wt 121.4 lb

## 2017-02-21 DIAGNOSIS — H905 Unspecified sensorineural hearing loss: Secondary | ICD-10-CM | POA: Diagnosis not present

## 2017-02-21 DIAGNOSIS — G43009 Migraine without aura, not intractable, without status migrainosus: Secondary | ICD-10-CM

## 2017-02-21 DIAGNOSIS — F0781 Postconcussional syndrome: Secondary | ICD-10-CM | POA: Diagnosis not present

## 2017-02-21 MED ORDER — NORTRIPTYLINE HCL 10 MG PO CAPS: 10.0000 mg | ORAL_CAPSULE | Freq: Every day | ORAL | 0 refills | Status: DC
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
## 2017-02-21 NOTE — Patient Instructions (Addendum)
1.  Consider starting nortriptyline 10mg  at bedtime to help reduce frequency of migraines.  Contact me in 4 weeks with update and we can increase dose if needed. 2.  Use sumatriptan 100mg  at earliest onset of migraine.  May repeat dose once in 2 hours if needed.  Do not exceed 2 tablets in 24 hours. 3.  Limit use of pain relievers to no more than 2 days out of the week to prevent rebound headache 4.  Follow up with Dr. Tamala Julian. 5.  Limit work to 4 hours a day for the next month 6.  Follow up in 3 months.
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
## 2017-03-04 ENCOUNTER — Encounter: Payer: Self-pay | Admitting: Family Medicine

## 2017-03-04 ENCOUNTER — Ambulatory Visit (INDEPENDENT_AMBULATORY_CARE_PROVIDER_SITE_OTHER): Payer: No Typology Code available for payment source | Admitting: Family Medicine

## 2017-03-04 ENCOUNTER — Other Ambulatory Visit (INDEPENDENT_AMBULATORY_CARE_PROVIDER_SITE_OTHER): Payer: No Typology Code available for payment source

## 2017-03-04 VITALS — BP 108/80 | HR 64 | Ht 63.0 in | Wt 123.0 lb

## 2017-03-04 DIAGNOSIS — F0781 Postconcussional syndrome: Secondary | ICD-10-CM | POA: Diagnosis not present

## 2017-03-04 DIAGNOSIS — R51 Headache: Secondary | ICD-10-CM

## 2017-03-04 DIAGNOSIS — G8929 Other chronic pain: Secondary | ICD-10-CM

## 2017-03-04 DIAGNOSIS — M999 Biomechanical lesion, unspecified: Secondary | ICD-10-CM | POA: Insufficient documentation

## 2017-03-04 LAB — SEDIMENTATION RATE: Sed Rate: 4 mm/hr (ref 0–20)

## 2017-03-04 LAB — TSH: TSH: 3.63 u[IU]/mL (ref 0.35–4.50)

## 2017-03-04 LAB — CBC WITH DIFFERENTIAL/PLATELET
BASOS PCT: 0.5 % (ref 0.0–3.0)
Basophils Absolute: 0 10*3/uL (ref 0.0–0.1)
EOS PCT: 4 % (ref 0.0–5.0)
Eosinophils Absolute: 0.1 10*3/uL (ref 0.0–0.7)
HEMATOCRIT: 36.9 % (ref 36.0–46.0)
Hemoglobin: 12.5 g/dL (ref 12.0–15.0)
LYMPHS ABS: 1.6 10*3/uL (ref 0.7–4.0)
Lymphocytes Relative: 44.7 % (ref 12.0–46.0)
MCHC: 33.9 g/dL (ref 30.0–36.0)
MCV: 91.3 fl (ref 78.0–100.0)
Monocytes Absolute: 0.3 10*3/uL (ref 0.1–1.0)
Monocytes Relative: 8.8 % (ref 3.0–12.0)
NEUTROS ABS: 1.5 10*3/uL (ref 1.4–7.7)
NEUTROS PCT: 42 % — AB (ref 43.0–77.0)
PLATELETS: 173 10*3/uL (ref 150.0–400.0)
RBC: 4.04 Mil/uL (ref 3.87–5.11)
RDW: 13 % (ref 11.5–15.5)
WBC: 3.5 10*3/uL — ABNORMAL LOW (ref 4.0–10.5)

## 2017-03-04 LAB — IBC PANEL
IRON: 97 ug/dL (ref 42–145)
Saturation Ratios: 24.2 % (ref 20.0–50.0)
TRANSFERRIN: 286 mg/dL (ref 212.0–360.0)

## 2017-03-04 LAB — VITAMIN D 25 HYDROXY (VIT D DEFICIENCY, FRACTURES): VITD: 32.95 ng/mL (ref 30.00–100.00)

## 2017-03-04 NOTE — Assessment & Plan Note (Signed)
Decision today to treat with OMT was based on Physical Exam  After verbal consent patient was treated with  ME, FPR techniques in cervical, thoracic, lumbar and sacral areas  Patient tolerated the procedure well with improvement in symptoms  Patient given exercises, stretches and lifestyle modifications  See medications in patient instructions if given  Patient will follow up in 4 weeks 

## 2017-03-04 NOTE — Patient Instructions (Addendum)
Good to see you  Lets try a couple of things.  We attempted manipulation today and I hope it helps Posture and ergonomics will be important  We may need to change the irritation of the nerves and certain medicines may be necessary start with the nortriptyline nightly for at least 2 weeks.  Lets get labs downstairs See me again in 4 weeks Read about osteopathic manipulation

## 2017-03-04 NOTE — Assessment & Plan Note (Signed)
Patient continues to have postconcussive syndrome. Patient continues to having difficulty being with increasing her activity. Patient will start to try to restart formal physical therapy. We did attempt osteopathic manipulation today. Encourage her to take the nortriptyline that was prescribed by the provider. Patient and will come back and see me again 3-4 weeks. Laboratory workup also prescribed to see if anything else organic could cause headaces and pain

## 2017-03-05 LAB — RHEUMATOID FACTOR: RHEUMATOID FACTOR: 39 [IU]/mL — AB (ref <14)

## 2017-03-05 LAB — ANA: Anti Nuclear Antibody(ANA): NEGATIVE

## 2017-03-07 ENCOUNTER — Ambulatory Visit: Payer: No Typology Code available for payment source | Admitting: Family Medicine

## 2017-03-13 ENCOUNTER — Ambulatory Visit: Payer: No Typology Code available for payment source | Attending: Sports Medicine | Admitting: Physical Therapy

## 2017-03-13 ENCOUNTER — Encounter: Payer: Self-pay | Admitting: Physical Therapy

## 2017-03-13 DIAGNOSIS — M6281 Muscle weakness (generalized): Secondary | ICD-10-CM | POA: Insufficient documentation

## 2017-03-13 DIAGNOSIS — M62838 Other muscle spasm: Secondary | ICD-10-CM | POA: Diagnosis present

## 2017-03-13 DIAGNOSIS — M542 Cervicalgia: Secondary | ICD-10-CM

## 2017-03-13 NOTE — Patient Instructions (Signed)
Access Code: [email or web address]  URL: [email or web address]  Date: 03/13/2017  Prepared by: Colin Rhein   Exercises  Bent Knee Fallouts - 10 reps - 2 sets - 1x daily - 7x weekly  Supine 90/90 Alternating Toe Touch - 10 reps - 2 sets - 1x daily - 7x weekly  Supine Transversus Abdominis Bracing with Leg Extension - 10 reps - 2 sets - 1x daily - 7x weekly  Supine Bridge with Spinal Articulation - 10 reps - 2 sets - 1x daily - 7x weekly  Supine Cervical Retraction with Towel - 10 reps - 2 sets - 1x daily - 7x weekly  Cervical AROM Flexion and Rotation - 3 reps - 1 sets - 1x daily - 7x weekly  Seated Cervical Sidebending AROM - 3 reps - 1 sets - 1x daily - 7x weekly
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
## 2017-03-20 ENCOUNTER — Ambulatory Visit: Payer: No Typology Code available for payment source | Admitting: Physical Therapy

## 2017-03-25 ENCOUNTER — Ambulatory Visit: Payer: No Typology Code available for payment source | Admitting: Physical Therapy

## 2017-03-25 DIAGNOSIS — M6281 Muscle weakness (generalized): Secondary | ICD-10-CM

## 2017-03-25 DIAGNOSIS — M62838 Other muscle spasm: Secondary | ICD-10-CM

## 2017-03-25 DIAGNOSIS — M542 Cervicalgia: Secondary | ICD-10-CM

## 2017-03-25 NOTE — Therapy (Signed)
[place], [name], [address] Phone: [phone]   Fax:  [phone]  Physical Therapy Treatment  Patient Details  Name: Stephanie Foley MRN: [id number] Date of Birth: 1966-03-25 Referring Provider: Dr. Edmonia Lynch  Encounter Date: 03/25/2017      PT End of Session - 03/25/17 1115    Visit Number 2   Date for PT Re-Evaluation 05/08/17   Authorization Type --   PT Start Time 0934   PT Stop Time 1030   PT Time Calculation (min) 56 min   Activity Tolerance Patient tolerated treatment well   Behavior During Therapy [facility] for tasks assessed/performed      Past Medical History:  Diagnosis Date  . Cancer ([place])    basal cell, different areas of body  . Fibromyalgia   . Rosacea     Past Surgical History:  Procedure Laterality Date  . BASAL CELL CARCINOMA EXCISION  2009   times 5  . BREAST SURGERY  1999 or 2000   mastitis  . MOHS SURGERY     for basal cell (face)    There were no vitals filed for this visit.      Subjective Assessment - 03/25/17 1113    Subjective Starting back at work this week.  Rt. side of neck is so sore.  Back hurt this weekend with driving to [facility].     Currently in Pain? Yes   Pain Score 5    Pain Location Neck   Pain Orientation Right   Pain Descriptors / Indicators Tightness   Pain Type Acute pain;Chronic pain   Pain Onset More than a month ago   Pain Frequency Intermittent              [facility] Adult PT Treatment/Exercise - 03/25/17 0001      Neck Exercises: Supine   Neck Retraction 10 reps;5 secs   Capital Flexion 10 reps   Cervical Rotation 10 reps     Lumbar Exercises: Supine   Ab Set 10 reps   Clam 10 reps   Clam Limitations uni and bilateral    Bent Knee Raise 10 reps   Bridge 10 reps     Moist Heat Therapy   Number Minutes Moist Heat 10 Minutes   Moist Heat Location Cervical     Manual Therapy   Soft tissue mobilization cervicals,  suboccipitals, Rt. upper trap and Rt. levator scap    Myofascial Release R scapula      Neck Exercises: Stretches   Upper Trapezius Stretch 2 reps;30 seconds   Levator Stretch 2 reps;30 seconds                PT Education - 03/25/17 1114    Education provided Yes   Education Details HEP reinforcement, cervical soft tissue , DN    Person(s) Educated Patient   Methods Explanation   Comprehension Verbalized understanding;Returned demonstration          PT Short Term Goals - 03/25/17 1127      PT SHORT TERM GOAL #1   Title independent with initial HEP   Status Achieved     PT SHORT TERM GOAL #2   Title pain with daily activities decreased >/= 25%   Status On-going     PT SHORT TERM GOAL #3   Title understand correct posture to decrease strain on cervical and lumbar spine , demo safe lifting and sitting posture   Status On-going  PT SHORT TERM GOAL #4   Title ability to eat, read and sit with pain decreased >/= due to decreased muscle tightness   Status On-going           PT Long Term Goals - 03/13/17 1253      PT LONG TERM GOAL #1   Title independent with HEP   Time 8   Status New   Target Date 05/08/17     PT LONG TERM GOAL #2   Title Back pain decreased to none >75% of the time with sitting and standing for work activities.    Time 8   Period Weeks   Status New   Target Date 05/08/17     PT LONG TERM GOAL #3   Title Pt will be able to work > 4 hours per day with symptoms controlled.    Time 8   Period Weeks   Status New   Target Date 05/08/17     PT LONG TERM GOAL #4   Title pt will transition to community/group Pilates or fitness classes without increasing pain in neck or low back.    Time 8   Period Weeks   Status New   Target Date 05/08/17               Plan - 03/25/17 1125    Clinical Impression Statement Focused on neck and Rt. UE work today.   After session she said her arm felt heavy, soreness increased in neck and Rt.  UE.  Educated on rationale of manual and referral of pain. She had high sensitivity throughout periscapular muscles and into lateral Rt. cervical spine.  Advised she apply ice if pain escalates.     PT Next Visit Plan check HEP, core stabilization and dry needling to upper back/periscap, upper trap, cervicals    PT Home Exercise Plan Lumbat stab and neck stretch, chin tuck    Consulted and Agree with Plan of Care Patient      Patient will benefit from skilled therapeutic intervention in order to improve the following deficits and impairments:  Decreased range of motion, Increased fascial restricitons, Pain, Decreased endurance, Increased muscle spasms, Decreased strength, Decreased mobility, Decreased activity tolerance, Impaired flexibility, Impaired UE functional use, Hypomobility, Dizziness, Postural dysfunction  Visit Diagnosis: Cervicalgia  Other muscle spasm  Muscle weakness (generalized)     Problem List Patient Active Problem List   Diagnosis Date Noted  . Nonallopathic lesion of cervical region 03/04/2017  . Nonallopathic lesion of thoracic region 03/04/2017  . Nonallopathic lesion of lumbosacral region 03/04/2017  . Postconcussive syndrome 11/20/2016    PAA,JENNIFER 03/25/2017, 11:30 AM  [facility] [address] [place], [name], [address] Phone: [phone]   Fax:  [phone]  Name: Stephanie Foley MRN: [id number] Date of Birth: 21-May-1966  Raeford Razor, PT 03/25/17 11:32 AM Phone: [phone] Fax: [phone]

## 2017-03-27 ENCOUNTER — Ambulatory Visit: Payer: No Typology Code available for payment source | Admitting: Physical Therapy

## 2017-03-27 ENCOUNTER — Encounter: Payer: Self-pay | Admitting: Physical Therapy

## 2017-03-27 DIAGNOSIS — M6281 Muscle weakness (generalized): Secondary | ICD-10-CM

## 2017-03-27 DIAGNOSIS — M542 Cervicalgia: Secondary | ICD-10-CM

## 2017-03-27 DIAGNOSIS — M62838 Other muscle spasm: Secondary | ICD-10-CM
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
## 2017-04-01 ENCOUNTER — Ambulatory Visit: Payer: No Typology Code available for payment source | Admitting: Physical Therapy

## 2017-04-01 ENCOUNTER — Encounter: Payer: Self-pay | Admitting: Physical Therapy

## 2017-04-01 DIAGNOSIS — M62838 Other muscle spasm: Secondary | ICD-10-CM

## 2017-04-01 DIAGNOSIS — M542 Cervicalgia: Secondary | ICD-10-CM

## 2017-04-01 DIAGNOSIS — M6281 Muscle weakness (generalized): Secondary | ICD-10-CM
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
## 2017-04-03 ENCOUNTER — Ambulatory Visit (INDEPENDENT_AMBULATORY_CARE_PROVIDER_SITE_OTHER): Payer: No Typology Code available for payment source | Admitting: Family Medicine

## 2017-04-03 ENCOUNTER — Encounter: Payer: Self-pay | Admitting: Physical Therapy

## 2017-04-03 ENCOUNTER — Ambulatory Visit: Payer: No Typology Code available for payment source | Admitting: Physical Therapy

## 2017-04-03 ENCOUNTER — Encounter: Payer: Self-pay | Admitting: Family Medicine

## 2017-04-03 VITALS — BP 110/72 | HR 85 | Ht 63.0 in | Wt 120.0 lb

## 2017-04-03 DIAGNOSIS — M6281 Muscle weakness (generalized): Secondary | ICD-10-CM

## 2017-04-03 DIAGNOSIS — M62838 Other muscle spasm: Secondary | ICD-10-CM

## 2017-04-03 DIAGNOSIS — M542 Cervicalgia: Secondary | ICD-10-CM

## 2017-04-03 DIAGNOSIS — M999 Biomechanical lesion, unspecified: Secondary | ICD-10-CM | POA: Diagnosis not present

## 2017-04-03 DIAGNOSIS — F0781 Postconcussional syndrome: Secondary | ICD-10-CM | POA: Diagnosis not present

## 2017-04-03 NOTE — Assessment & Plan Note (Signed)
Decision today to treat with OMT was based on Physical Exam  After verbal consent patient was treated with HVLA, ME, FPR techniques in cervical, thoracic, lumbar and sacral areas  Patient tolerated the procedure well with improvement in symptoms  Patient given exercises, stretches and lifestyle modifications  See medications in patient instructions if given  Patient will follow up in 6 weeks 

## 2017-04-03 NOTE — Therapy (Signed)
[place], [name], [address] Phone: [phone]   Fax:  [phone]  Physical Therapy Treatment  Patient Details  Name: Stephanie Foley MRN: [id number] Date of Birth: 1966/07/23 Referring Provider: Dr. Edmonia Lynch  Encounter Date: 04/03/2017      PT End of Session - 04/03/17 1245    Visit Number 5   Number of Visits 16   Date for PT Re-Evaluation 05/08/17   PT Start Time 5176   PT Stop Time 1248   PT Time Calculation (min) 54 min   Activity Tolerance Patient tolerated treatment well   Behavior During Therapy [facility] for tasks assessed/performed      Past Medical History:  Diagnosis Date  . Cancer ([place])    basal cell, different areas of body  . Fibromyalgia   . Rosacea     Past Surgical History:  Procedure Laterality Date  . BASAL CELL CARCINOMA EXCISION  2009   times 5  . BREAST SURGERY  1999 or 2000   mastitis  . MOHS SURGERY     for basal cell (face)    There were no vitals filed for this visit.      Subjective Assessment - 04/03/17 1156    Subjective "I just saw my DO today, and he manipulated my neck and im sore in my neck today, I still have the tightness in the R shoulder blade"    Currently in Pain? Yes   Pain Location Neck   Pain Orientation Right   Pain Descriptors / Indicators Tightness   Pain Type Acute pain;Chronic pain   Pain Onset More than a month ago   Pain Frequency Intermittent                         [facility] Adult PT Treatment/Exercise - 04/03/17 1249      Neck Exercises: Seated   Other Seated Exercise thoracic extension over chair 20x  verbal cues to keep chin tucked     Neck Exercises: Sidelying   Other Sidelying Exercise book opening 2 x 10 bil     Lumbar Exercises: Seated   Other Seated Lumbar Exercises seated pelvic tilt 1 x 10 with 3 sec hold     Manual Therapy   Manual Therapy Joint mobilization   Joint Mobilization PA grade 3-4  thoracic T5-T11   Soft tissue mobilization IASTM over the lower trap / rhomboids     Neck Exercises: Stretches   Upper Trapezius Stretch 2 reps;30 seconds   Levator Stretch 2 reps;30 seconds          Trigger Point Dry Needling - 04/03/17 1226    Consent Given? Yes   Education Handout Provided Yes   Muscles Treated Upper Body --  lower trap               PT Education - 04/03/17 1244    Education provided Yes   Education Details updated HEP for thoracic mobility exercise. muscle anatomy/ referral patterns. what TPDN is, benefits/ what to expect and after care.    Person(s) Educated Patient   Methods Explanation;Verbal cues;Handout   Comprehension Verbalized understanding;Verbal cues required          PT Short Term Goals - 03/25/17 1127      PT SHORT TERM GOAL #1   Title independent with initial HEP   Status Achieved     PT SHORT TERM GOAL #2   Title pain with daily  activities decreased >/= 25%   Status On-going     PT SHORT TERM GOAL #3   Title understand correct posture to decrease strain on cervical and lumbar spine , demo safe lifting and sitting posture   Status On-going     PT SHORT TERM GOAL #4   Title ability to eat, read and sit with pain decreased >/= due to decreased muscle tightness   Status On-going           PT Long Term Goals - 03/13/17 1253      PT LONG TERM GOAL #1   Title independent with HEP   Time 8   Status New   Target Date 05/08/17     PT LONG TERM GOAL #2   Title Back pain decreased to none >75% of the time with sitting and standing for work activities.    Time 8   Period Weeks   Status New   Target Date 05/08/17     PT LONG TERM GOAL #3   Title Pt will be able to work > 4 hours per day with symptoms controlled.    Time 8   Period Weeks   Status New   Target Date 05/08/17     PT LONG TERM GOAL #4   Title pt will transition to community/group Pilates or fitness classes without increasing pain in neck or low back.     Time 8   Period Weeks   Status New   Target Date 05/08/17               Plan - 04/03/17 1245    Clinical Impression Statement pt reports continued soreness in the mid/ R throacic region and heaviness in the R arm. She opted to avoid any neck treatment due to soreness from previous manipulation prior to todays session. TPDN was explained and performed on the R lower trap. continued soft tissue tecniques and thoracic mobility exericse which was provided for HEP. MHP post session which she reported no pain or referred heaviness.    PT Next Visit Plan how was DN, check HEP, core stabilization and dry needling to upper back/periscap, upper trap, cervicals; consider Roccabados if time permits.    PT Home Exercise Plan Lumbat stab and neck stretch, chin tuck, thoracic extension over chair, book opening, seated pelvic tilt   Consulted and Agree with Plan of Care Patient      Patient will benefit from skilled therapeutic intervention in order to improve the following deficits and impairments:  Decreased range of motion, Increased fascial restricitons, Pain, Decreased endurance, Increased muscle spasms, Decreased strength, Decreased mobility, Decreased activity tolerance, Impaired flexibility, Impaired UE functional use, Hypomobility, Dizziness, Postural dysfunction  Visit Diagnosis: Cervicalgia  Other muscle spasm  Muscle weakness (generalized)     Problem List Patient Active Problem List   Diagnosis Date Noted  . Nonallopathic lesion of cervical region 03/04/2017  . Nonallopathic lesion of thoracic region 03/04/2017  . Nonallopathic lesion of lumbosacral region 03/04/2017  . Postconcussive syndrome 11/20/2016   Starr Lake PT, DPT, LAT, ATC  04/03/17  12:52 PM      [facility] [address] [place], [name], [address] Phone: [phone]   Fax:  [phone]  Name: Stephanie Foley MRN: [id number] Date of Birth:  October 03, 1965

## 2017-04-03 NOTE — Patient Instructions (Signed)
Good to see you  You are making progress.  Lets continue the nortriptyline for another month then 3 times a week therafter I think dry needling will be helpful.  I am hoping we will get you in the gym this week or next week but start at 50% frequency and duration of what you used to do and increase one or the other every week by 10% See me again in 6 weeks
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
## 2017-04-03 NOTE — Assessment & Plan Note (Signed)
Patient is doing relatively well at this time. Continues to see the neurologist. I do believe the patient is getting closer to maximal medical improvement. Patient encouraged to start the exercises on a more regular basis as well as to go back to her regular physical activities. Patient will start to slowly increase over the course of time. We discussed icing regimen, which activities to do in which ones to avoid. Core strengthening. Follow-up again in 6 weeks.

## 2017-04-10 ENCOUNTER — Encounter: Payer: No Typology Code available for payment source | Admitting: Physical Therapy

## 2017-04-10 ENCOUNTER — Ambulatory Visit: Payer: No Typology Code available for payment source | Attending: Sports Medicine | Admitting: Physical Therapy

## 2017-04-10 DIAGNOSIS — M6281 Muscle weakness (generalized): Secondary | ICD-10-CM | POA: Insufficient documentation

## 2017-04-10 DIAGNOSIS — M542 Cervicalgia: Secondary | ICD-10-CM | POA: Insufficient documentation

## 2017-04-10 DIAGNOSIS — M62838 Other muscle spasm: Secondary | ICD-10-CM | POA: Diagnosis present

## 2017-04-10 NOTE — Patient Instructions (Signed)
PT Classroom - Temporomandibular Joint Exercise Prescription for Physical Therapists  by Owens Shark, MPT, MBA    The temporomandibular joint (TMJ) is a synovial joint formed by the condyle of the mandible, the articular eminence of the temporal bone, and the articular disc that is interposed between the two and divides each joint into two cavities (1). When treating the TMJ, the plan of care should address what is causing the problem to become manifest and the prevention of further degeneration of the joint (2). Numerous physical therapy interventions are potentially effective in managing temporomandibular disorders (TMD), including electrophysical modalities, exercise, and manual therapy techniques (3). Treatment of other regions of the body such as the cervical spine/related musculature, shoulder girdles, thoracic spine/related musculature, lumbar spine and pelvis are also important as these areas may play a postural component to the problem associated at the TMJ (4). When treating patients with TMD, patients with an acute onset or post traumatic injury are more likely to respond better to physical therapy intervention while patients with a chronic TMD tend to not respond as quickly (3). This article will review some of the treatment considerations for Temporomandibular Joint Disorders once a thorough physical therapy evaluation has been completed and a plan of care has been developed. (image from 20th U.S. edition of Gray's Anatomy of the Human Body published in 1918)   Rocabado 6 x 6 Exercise Program (5)  This exercise program by Wanda Plump addresses postural relationships with the head to neck, neck to shoulders and lower jaw to upper jaw. The objective of this home exercise program is for patients to: learn a new postural position, fight the soft tissue memory of the old position, restore the original muscle length-tension relationships, restore normal joint mobility and restore normal body  balance. Rocabado advocates the instruction of six fundamental components of activity for treatment of TMJ dysfunction. He recommends that patients complete each activity 6x/session and 6x/day.  The activities are as follows: 1) Rest position of the tongue a) Make a clucking sound with the tongue x 6 b) Find normal resting position = holding one third of tongue gently against the roof of the mouth just behind the front teeth c) Diaphragmatically breathe through nose while tongue is in resting position x 6 breaths 2) Control TMJ Rotation on Opening - tongue on roof of mouth and open x 6 reps 3) Mandibular Rhythmic Stabilization - apply light resistance to opening, closing, and lateral deviation with the jaw in a resting position holding for 6 seconds (this is key when a patient has instability as this assists with visualization/neuromuscular reeducation) 4) Stabilized Head Flexion = upper cervical flexion (nodding) - facilitate upper cervical flexion as most of these patients have forward head posture resulting in upper cervical extension deviation. Nod head x 15 degrees back and forth 6 x reps.  5) Lower Cervical Retraction - chin tuck x 6 second hold 6) Shoulder Girdle Retraction - pull shoulders back and down - hold x 6 seconds  Joint Mobilization (2)  When the TMJ is restricted joint mobilization can be performed in various directions to improve joint play at the temporomandibular joint.  1) Distraction 2) Distraction with anterior glide 3) Distraction with anterior glide and lateral glide right/left 4) lateral glide without distraction   Strengthening/Stabilization (6) Instructions from [place] Education Department  1) Isometric Contraction with Jaw in Neutral - Open your jaw to neutral. Place your two thumbs under your mandible (chin) and both your index fingers  so that they are touching your bottom teeth. You will then gradually apply resistance to your  lower teeth in the directions of lateral deviation, protrusion / retrusion or opening / closing while maintaining the position of your mandible.  2) Isometric Contraction of Supra/Infra-hyoid Muscles - Bring your head forward slightly forward about 15 degrees. With two fingers against your head, bring your head forward into your fingers so that your fingers resist upper cervical flexion. Don't allow your head to move past 15 degrees.  3) Isometric Cervical Flexion in Forward Trunk Flexion - Sit forward in a chair with your forehead supported by your forearm which is on a table. In this position, bring your head forward about 15 degrees by flexing into your forearm while maintaining your jaw in the resting position.  4) Resisted Lateral Deviation Out of Neutral - From a neutral posture, laterally deviate your jaw away from the involved side until your top and bottom canine teeth match. In this position, use 1-2 fingers to provide resistance for bringing your jaw back to a neutral position. "This helps to stabilize the articular disc on the condylar head by strengthening its lateral ligament attachments" (6).  Stretching/Mobility (6) Instructions from [place] Education Department  1) Functional Jaw Opening - In front of a mirror open and close your mouth to a comfortable distance while attempting to prevent your jaw from deviating out of a neutral position. In order to help with feedback, you can also palpate the condylar head of your TMJ joint and tongue with your index fingers. "Controlled opening facilitates joint mobility, good circulation to the condylar head, cartilage conditioning, relaxation of the pterygoid muscles, and neuromuscular control of a hypermobile joint" (6).  2) Controlled-ROM Lateral Deviation - In front of a mirror, with your jaw in a resting position, laterally deviate your jaw until the canine tooth on the lower jaw matches the canine tooth of the upper  jaw. In order to help with feedback you can also palpate the condylar head with one index finger and the upper canine tooth with another index finger to cue movement limitations. "Controlled lateral deviation causes distraction of the joint capsule, which can help attain ROM, improve joint mobility and circulation, and control muscular spasm" (6).  3) Lateral Deviation + Functional Opening - In front of a mirror, with jaw in a resting position, laterally deviate your jaw followed by opening of your mouth. This movement causes distraction of the joint capsule.  4) Protrusion ROM - In front of a mirror, with jaw in a resting position, perform protrusion of your mandible by bringing it forward followed by retrusion back to a resting position.  5) Self-stretch into Opening - Open your mouth in midline and provide a mild overpressure to the top and bottom teeth at end-range. "Prolonged stretching may also be recommended to increase opening ROM" (6). 6) Self-distraction Mobilizations - From a sitting and resting position, rotate your head slightly to the opposite side of the involved joint. Palpate the mandibular condyle with 1 finger while the other thumb and fingers from the uninvolved side are placed on the mandible towards the involved side. Distraction is provided by sidebending your wrist inwards (ulnar deviation).  Last revised: June 11, 2007 by Owens Shark, MPT, MBA References 1. Norkin CC, Levangie PK. (1992). Joint Structure & Function - A Comprehensive Analysis - Second Edition. [place], [place]: F.A. [employer]. 2. Tommi Rumps [facility], [place] Caster J: Application of Orthopedic Principles in Evaluation of the Temporomandibular  Joint. Physical Therapy [phone], 1982. 3. McNeely ML, [place], [place] DJ: A Systematic Review of the Effectiveness of Physical Therapy interventions for Temporomandibular Disorders. Physical Therapy 88;5:710-725, 2006 4. Gelb H. (1977). Clinical Management of Head, Neck and  TMJ Pain and Dysfunction: A Multi-Disciplinary Approach to Diagnosis and Treatment. [place], [employer]. 5 Rocabado M. Diagnosis and treatment of abnormal craniocervical and craniomandibular mechanics. In: Solber WK, Genia Del, eds. Abnormal Jaw Mechanics. Diagnosis and Treatments. [place]; 1984: 141-159  6. [facility] Clinical Education Department - Temporomandibular Joint Complex Exercise Suggestions [email or web address]

## 2017-04-11 ENCOUNTER — Encounter: Payer: Self-pay | Admitting: Physical Therapy
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
## 2017-04-15 ENCOUNTER — Encounter: Payer: Self-pay | Admitting: Physical Therapy

## 2017-04-15 ENCOUNTER — Ambulatory Visit: Payer: No Typology Code available for payment source | Admitting: Physical Therapy

## 2017-04-15 DIAGNOSIS — M542 Cervicalgia: Secondary | ICD-10-CM | POA: Diagnosis not present

## 2017-04-15 DIAGNOSIS — M62838 Other muscle spasm: Secondary | ICD-10-CM

## 2017-04-15 DIAGNOSIS — M6281 Muscle weakness (generalized): Secondary | ICD-10-CM
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
## 2017-04-16 ENCOUNTER — Ambulatory Visit: Payer: No Typology Code available for payment source | Admitting: Physical Therapy

## 2017-04-22 ENCOUNTER — Ambulatory Visit: Payer: No Typology Code available for payment source | Admitting: Physical Therapy

## 2017-04-24 ENCOUNTER — Ambulatory Visit: Payer: No Typology Code available for payment source | Admitting: Physical Therapy

## 2017-04-24 DIAGNOSIS — M6281 Muscle weakness (generalized): Secondary | ICD-10-CM

## 2017-04-24 DIAGNOSIS — M542 Cervicalgia: Secondary | ICD-10-CM

## 2017-04-24 DIAGNOSIS — M62838 Other muscle spasm: Secondary | ICD-10-CM

## 2017-04-24 NOTE — Therapy (Signed)
Stephanie Foley, [name], [address] Phone: [phone]   Fax:  [phone]  Physical Therapy Treatment  Patient Details  Name: Stephanie Foley MRN: [id number] Date of Birth: 1965/08/12 Referring Provider: Dr. Edmonia Lynch  Encounter Date: 04/24/2017      PT End of Session - 04/24/17 0944    Visit Number 8   Number of Visits 16   Date for PT Re-Evaluation 05/08/17   PT Start Time 0931   PT Stop Time 1027   PT Time Calculation (min) 56 min   Activity Tolerance Patient tolerated treatment well   Behavior During Therapy [facility] for tasks assessed/performed      Past Medical History:  Diagnosis Date  . Cancer ([place])    basal cell, different areas of body  . Fibromyalgia   . Rosacea     Past Surgical History:  Procedure Laterality Date  . BASAL CELL CARCINOMA EXCISION  2009   times 5  . BREAST SURGERY  1999 or 2000   mastitis  . MOHS SURGERY     for basal cell (face)    There were no vitals filed for this visit.      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 Rt. arm hurts today.     Currently in Pain? Yes   Pain Score 3    Pain Location Arm   Pain Orientation Right;Posterior;Lateral   Pain Descriptors / Indicators Sore;Aching;Other (Comment)  weak   Pain Type Chronic pain   Pain Onset More than a month ago   Pain Frequency Intermittent              [facility] Adult PT Treatment/Exercise - 04/24/17 0001      Shoulder Exercises: Standing   Horizontal ABduction Strengthening;Both;10 reps   Theraband Level (Shoulder Horizontal ABduction) Level 3 (Green)   Row Strengthening;Both;15 reps   Theraband Level (Shoulder Row) Level 3 (Green)   Row Weight (lbs) showed how to [employer] ex.    Retraction Strengthening;Right;5 reps     Shoulder Exercises: ROM/Strengthening   Other ROM/Strengthening Exercises  Pilates Reformer see note      Moist Heat Therapy   Number Minutes Moist Heat 10 Minutes   Moist Heat Location Cervical      Pilates Reformer used for LE/core strength, postural strength, lumbopelvic disassociation and core control.  Exercises included:  Supine Arm work 1 red 1 yellow arcs parallel and T press, 1 red only, x 10 each cues for maintaining table top with LE   Long box Prone overhead press double and single arm and pre swan for thoracic ext 1 red min cues for neck , difficulty with L side due to weakess.    Seated single arm row with trunk rotation. 1 Red, mod cues for Rt. Scapular and timing  Of cervical rotation.          PT Education - 04/24/17 [phone]    Education provided Yes   Education Details Pilates Reformer and stabilzation concepts.    Person(s) Educated Patient   Methods Explanation;Demonstration;Tactile cues;Verbal cues   Comprehension Need further instruction;Verbalized understanding          PT Short Term Goals - 04/24/17 0948      PT SHORT TERM GOAL #1   Title independent with initial HEP   Status Achieved     PT SHORT TERM GOAL #  2   Title pain with daily activities decreased >/= 25%   Status On-going     PT SHORT TERM GOAL #3   Title understand correct posture to decrease strain on cervical and lumbar spine , demo safe lifting and sitting posture   Status Achieved     PT SHORT TERM GOAL #4   Title ability to eat, read and sit with pain decreased >/= due to decreased muscle tightness   Status On-going           PT Long Term Goals - 04/24/17 0949      PT LONG TERM GOAL #1   Title independent with HEP   Status On-going     PT LONG TERM GOAL #2   Title Back pain decreased to none >75% of the time with sitting and standing for work activities.    Status On-going     PT LONG TERM GOAL #3   Title Pt will be able to work > 4 hours per day with symptoms controlled.    Status On-going     PT LONG TERM GOAL #4   Title pt will  transition to community/group Pilates or fitness classes without increasing pain in neck or low back.    Status On-going               Plan - 04/24/17 1017    Clinical Impression Statement Pt was pleased to do physical activity, no pain during supine and prone work.  She had difficulty with single arm row and timing of scapular stabilizers to support shoulder.  Min increase in jaw tension, neck tension with this.     PT Next Visit Plan  core stabilization , manual to Rt. scap, neck and PILATES ; consider Roccabados if time permits. FOTO    PT Home Exercise Plan Lumbar stab and neck stretch, chin tuck, thoracic extension over chair, book opening, seated pelvic tilt, green band horiz abd and row    Consulted and Agree with Plan of Care Patient      Patient will benefit from skilled therapeutic intervention in order to improve the following deficits and impairments:  Decreased range of motion, Increased fascial restricitons, Pain, Decreased endurance, Increased muscle spasms, Decreased strength, Decreased mobility, Decreased activity tolerance, Impaired flexibility, Impaired UE functional use, Hypomobility, Dizziness, Postural dysfunction  Visit Diagnosis: Cervicalgia  Other muscle spasm  Muscle weakness (generalized)     Problem List Patient Active Problem List   Diagnosis Date Noted  . Nonallopathic lesion of cervical region 03/04/2017  . Nonallopathic lesion of thoracic region 03/04/2017  . Nonallopathic lesion of lumbosacral region 03/04/2017  . Postconcussive syndrome 11/20/2016    [name] 04/24/2017, 10:23 AM  [facility] [address] [place], [name], [address] Phone: [phone]   Fax:  [phone]  Name: Stephanie Foley MRN: [id number] Date of Birth: 1965-12-17  Raeford Razor, PT 04/24/17 10:24 AM Phone: [phone] Fax: [phone]

## 2017-04-24 NOTE — Patient Instructions (Addendum)
Resistive Band Rowing    With resistive band anchored in door, grasp both ends. Keeping elbows bent, pull back, squeezing shoulder blades together. Hold _5___ seconds. Repeat ___10_ times. Do ___2_ sessions per day.   THEN Cross the band and do one arm adding in rotation of neck and upper back, keep chin horizontal, scapula retracted (pulled back) and down your back.    http://gt2.exer.us/98   Copyright  VHI. All rights reserved.    Resisted Horizontal Abduction: Bilateral    Sit or stand, tubing in both hands, arms out in front. Keeping arms straight, pinch shoulder blades together and stretch arms out.  KEEP NECK LONG, elbows straight and scapula against the wall.   Repeat ___10_ times per set. Do __1-2__ sets per session. Do ___2_ sessions per day.  http://orth.exer.us/969   Copyright  VHI. All rights reserved.

## 2017-04-29 ENCOUNTER — Encounter: Payer: No Typology Code available for payment source | Admitting: Physical Therapy

## 2017-05-01 ENCOUNTER — Ambulatory Visit: Payer: No Typology Code available for payment source | Admitting: Physical Therapy

## 2017-05-01 DIAGNOSIS — M542 Cervicalgia: Secondary | ICD-10-CM

## 2017-05-01 DIAGNOSIS — M62838 Other muscle spasm: Secondary | ICD-10-CM

## 2017-05-01 DIAGNOSIS — M6281 Muscle weakness (generalized): Secondary | ICD-10-CM

## 2017-05-01 NOTE — Therapy (Signed)
[place], [name], [address] Phone: [phone]   Fax:  [phone]  Physical Therapy Treatment  Patient Details  Name: Stephanie Foley MRN: [id number] Date of Birth: 1966/01/06 Referring Provider: Dr. Edmonia Lynch  Encounter Date: 05/01/2017      PT End of Session - 05/01/17 1058    Visit Number 9   Number of Visits 16   Date for PT Re-Evaluation 05/08/17   PT Start Time 8921   PT Stop Time 1100   PT Time Calculation (min) 45 min   Activity Tolerance Patient tolerated treatment well   Behavior During Therapy [facility] for tasks assessed/performed      Past Medical History:  Diagnosis Date  . Cancer ([place])    basal cell, different areas of body  . Fibromyalgia   . Rosacea     Past Surgical History:  Procedure Laterality Date  . BASAL CELL CARCINOMA EXCISION  2009   times 5  . BREAST SURGERY  1999 or 2000   mastitis  . MOHS SURGERY     for basal cell (face)    There were no vitals filed for this visit.      Subjective Assessment - 05/01/17 1021    Subjective The Pilates last week did not make me worse, "good sore".  Feels like she is doing better.  Had a "full body" migraine the other day.  Sitting alot todya, desk/paperwork.    Currently in Pain? Yes   Pain Score 4    Pain Location Scapula   Pain Orientation Right;Posterior   Pain Type Chronic pain   Pain Onset More than a month ago   Pain Frequency Intermittent   Aggravating Factors  sitting, driving    Pain Relieving Factors therapy              [facility] Adult PT Treatment/Exercise - 05/01/17 0001      Shoulder Exercises: Supine   Other Supine Exercises supine scapular stabilization: x 10-15 reps, overhead, horizontal abd, ER and sash      Shoulder Exercises: ROM/Strengthening   Wall Wash UE Ranger reaching Rt UE into flexion wiht lunge and slight lateral flexion    Wall Pushups 20 reps   Pushups Limitations elbows wide,  elbows in , min cues    Other ROM/Strengthening Exercises protraction, retractionx 10   Other ROM/Strengthening Exercises rhomboid stretch x 3 , 20 sec      Manual Therapy   Joint Mobilization scapular    Soft tissue mobilization levator scap and rhomboids and subscapularis    Myofascial Release Rt. scapular mobs and soft tissue compression    Passive ROM all planes                 PT Education - 05/01/17 1410    Education provided Yes   Education Details scapular mobility    Person(s) Educated Patient   Methods Explanation   Comprehension Verbalized understanding          PT Short Term Goals - 05/01/17 1042      PT SHORT TERM GOAL #1   Title independent with initial HEP   Status Achieved     PT SHORT TERM GOAL #2   Title pain with daily activities decreased >/= 25%   Baseline improving now since the last weeks , maybe 25%   Status Achieved     PT SHORT TERM GOAL #3   Title understand correct posture to decrease strain on cervical and  lumbar spine , demo safe lifting and sitting posture   Status Achieved     PT SHORT TERM GOAL #4   Title ability to eat, read and sit with pain decreased >/= due to decreased muscle tightness   Baseline improving    Status On-going           PT Long Term Goals - 05/01/17 1043      PT LONG TERM GOAL #1   Title independent with HEP   Status On-going     PT LONG TERM GOAL #2   Title Back pain decreased to none >75% of the time with sitting and standing for work activities.    Baseline travelled last weekend and had min to mod pain , but its better    Status On-going     PT LONG TERM GOAL #3   Title Pt will be able to work > 4 hours per day with symptoms controlled.    Status On-going     PT LONG TERM GOAL #4   Title pt will transition to community/group Pilates or fitness classes without increasing pain in neck or low back.    Status On-going               Plan - 05/01/17 1411    Clinical Impression  Statement Improved scapular mobility folllowing manual scapular work.  She is not interested in dry needling at this time.  She is still limited by low back pain with extended periods of sitting but less than when she began PT.  She did better with scapular exercises today in supine rather than being in standing.    PT Next Visit Plan  core stabilization , manual to Rt. scap, neck and PILATES ; consider Roccabados if time permits.   PT Home Exercise Plan Lumbar stab and neck stretch, chin tuck, thoracic extension over chair, book opening, seated pelvic tilt, green band horiz abd and row    Consulted and Agree with Plan of Care Patient      Patient will benefit from skilled therapeutic intervention in order to improve the following deficits and impairments:  Decreased range of motion, Increased fascial restricitons, Pain, Decreased endurance, Increased muscle spasms, Decreased strength, Decreased mobility, Decreased activity tolerance, Impaired flexibility, Impaired UE functional use, Hypomobility, Dizziness, Postural dysfunction  Visit Diagnosis: Cervicalgia  Other muscle spasm  Muscle weakness (generalized)     Problem List Patient Active Problem List   Diagnosis Date Noted  . Nonallopathic lesion of cervical region 03/04/2017  . Nonallopathic lesion of thoracic region 03/04/2017  . Nonallopathic lesion of lumbosacral region 03/04/2017  . Postconcussive syndrome 11/20/2016    PAA,JENNIFER 05/01/2017, 2:18 PM  [facility] [address] [place], [name], [address] Phone: [phone]   Fax:  [phone]  Name: Stephanie Foley MRN: [id number] Date of Birth: 1966-06-18   Raeford Razor, PT 05/01/17 2:18 PM Phone: [phone] Fax: [phone]

## 2017-05-06 ENCOUNTER — Encounter: Payer: No Typology Code available for payment source | Admitting: Physical Therapy

## 2017-05-08 ENCOUNTER — Ambulatory Visit: Payer: No Typology Code available for payment source | Attending: Sports Medicine | Admitting: Physical Therapy

## 2017-05-08 ENCOUNTER — Encounter: Payer: Self-pay | Admitting: Physical Therapy

## 2017-05-08 DIAGNOSIS — M6281 Muscle weakness (generalized): Secondary | ICD-10-CM | POA: Diagnosis present

## 2017-05-08 DIAGNOSIS — M542 Cervicalgia: Secondary | ICD-10-CM | POA: Diagnosis not present

## 2017-05-08 DIAGNOSIS — M62838 Other muscle spasm: Secondary | ICD-10-CM | POA: Diagnosis present
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
## 2017-05-08 NOTE — Patient Instructions (Signed)
Pilates mat class flyer

## 2017-05-15 ENCOUNTER — Ambulatory Visit: Payer: No Typology Code available for payment source | Admitting: Family Medicine

## 2017-05-16 ENCOUNTER — Ambulatory Visit: Payer: No Typology Code available for payment source | Admitting: Physical Therapy

## 2017-05-20 ENCOUNTER — Ambulatory Visit: Payer: No Typology Code available for payment source | Admitting: Physical Therapy

## 2017-05-20 DIAGNOSIS — M542 Cervicalgia: Secondary | ICD-10-CM | POA: Diagnosis not present

## 2017-05-20 DIAGNOSIS — M62838 Other muscle spasm: Secondary | ICD-10-CM

## 2017-05-20 DIAGNOSIS — M6281 Muscle weakness (generalized): Secondary | ICD-10-CM

## 2017-05-20 NOTE — Therapy (Signed)
[place], [name], [address] Phone: [phone]   Fax:  [phone]  Physical Therapy Treatment  Patient Details  Name: Stephanie Foley MRN: [id number] Date of Birth: Oct 01, 1965 Referring Provider: Dr. Edmonia Lynch  Encounter Date: 05/20/2017      PT End of Session - 05/20/17 0902    Visit Number 11   Number of Visits 16   Date for PT Re-Evaluation 06/19/17   PT Start Time 0856   PT Stop Time 0940   PT Time Calculation (min) 44 min   Activity Tolerance Patient tolerated treatment well   Behavior During Therapy [facility] for tasks assessed/performed      Past Medical History:  Diagnosis Date  . Cancer ([place])    basal cell, different areas of body  . Fibromyalgia   . Rosacea     Past Surgical History:  Procedure Laterality Date  . BASAL CELL CARCINOMA EXCISION  2009   times 5  . BREAST SURGERY  1999 or 2000   mastitis  . MOHS SURGERY     for basal cell (face)    There were no vitals filed for this visit.      Subjective Assessment - 05/20/17 0857    Subjective no pain right now.  Not any full on migraines in a bit.  Did better in Pilates with head down. Pain is intermittent (stiffness).  Has been able to hold off her migraines.    Currently in Pain? No/denies            [facility] Adult PT Treatment/Exercise - 05/20/17 0001      Self-Care   Other Self-Care Comments  DNF, anatomy, stabilization, assisted with towel to ease stress on anterior neck structures     Neck Exercises: Supine   Neck Retraction 10 reps   Capital Flexion 10 reps   Capital Flexion Limitations lift off: 3 x 10 sec used and to cue    Cervical Rotation Both;10 reps   Other Supine Exercise Deep neck flexor strengthening   Other Supine Exercise done with towel to assis strain x 10      Moist Heat Therapy   Number Minutes Moist Heat 10 Minutes   Moist Heat Location Cervical     Manual Therapy   Soft tissue  mobilization posterior cervicals, levator scapulae, upper traps   Manual Traction gentle traction in neutral and with lateral flexion (slight) to L                 PT Education - 05/20/17 0902    Education Details Deep neck flexor strengthening    Person(s) Educated Patient   Methods Explanation   Comprehension Verbalized understanding          PT Short Term Goals - 05/08/17 0910      PT SHORT TERM GOAL #1   Title independent with initial HEP   Status Achieved     PT SHORT TERM GOAL #2   Title pain with daily activities decreased >/= 25%   Status Achieved     PT SHORT TERM GOAL #3   Title understand correct posture to decrease strain on cervical and lumbar spine , demo safe lifting and sitting posture   Status Achieved     PT SHORT TERM GOAL #4   Status Achieved           PT Long Term Goals - 05/08/17 0911      PT LONG TERM GOAL #1  Title independent with HEP   Status On-going     PT LONG TERM GOAL #2   Title Back pain decreased to none >75% of the time with sitting and standing for work activities.    Status Achieved     PT LONG TERM GOAL #3   Title Pt will be able to work > 4 hours per day with symptoms controlled.    Status On-going     PT LONG TERM GOAL #4   Title pt will transition to community/group Pilates or fitness classes without increasing pain in neck or low back.    Status On-going               Plan - 05/20/17 1248    Clinical Impression Statement Pt arr late. She has no pain today.  Was able to do Pilates class with modifications for neck tension without increased pain.  She has been able to stave off her migraines this weekend.  Worked on deep neck flexors for stability.     PT Next Visit Plan cervical stab with bands, manual to Rt. scap, neck, manual traction, scapular mobility , use private room due to light sensitivity   PT Home Exercise Plan Lumbar stab and neck stretch, chin tuck, thoracic extension over chair, book  opening, seated pelvic tilt, green band horiz abd and row    Consulted and Agree with Plan of Care Patient      Patient will benefit from skilled therapeutic intervention in order to improve the following deficits and impairments:  Decreased range of motion, Increased fascial restricitons, Pain, Decreased endurance, Increased muscle spasms, Decreased strength, Decreased mobility, Decreased activity tolerance, Impaired flexibility, Impaired UE functional use, Hypomobility, Dizziness, Postural dysfunction  Visit Diagnosis: Cervicalgia  Other muscle spasm  Muscle weakness (generalized)     Problem List Patient Active Problem List   Diagnosis Date Noted  . Nonallopathic lesion of cervical region 03/04/2017  . Nonallopathic lesion of thoracic region 03/04/2017  . Nonallopathic lesion of lumbosacral region 03/04/2017  . Postconcussive syndrome 11/20/2016    Kerith Sherley 05/20/2017, 12:51 PM  [facility] [address] [place], [name], [address] Phone: [phone]   Fax:  [phone]  Name: Stephanie Foley MRN: [id number] Date of Birth: 11-27-1965  Raeford Razor, PT 05/20/17 12:51 PM Phone: [phone] Fax: [phone]

## 2017-05-21 ENCOUNTER — Telehealth: Payer: Self-pay | Admitting: Neurology

## 2017-05-21 NOTE — Telephone Encounter (Signed)
LM for Pt to call me back

## 2017-05-21 NOTE — Telephone Encounter (Signed)
Pt called and said she is really dizzy and wonders if it is her medication and would like a call back to advise

## 2017-05-22 NOTE — Telephone Encounter (Signed)
Pt rtrnd call, has been experiencing increased dizziness over the last 4-5 days. Did extended driving over the weekend, had P/T on Mon. Has not had any allergy symptoms, has not been using zyrtec. Laying down and resting does help symptoms. Sudden movements makes it worse, is having to hold on to things at work to walk. Any recommendations?

## 2017-05-22 NOTE — Telephone Encounter (Signed)
Called and spoke with Pt, she is actually taking nortriptyline, Dr Gardenia Phlegm restarted her on it. Advsd her to rest and cut back on extended driving until this resolves. She is to call if symptoms do not resolve in a few days

## 2017-05-22 NOTE — Telephone Encounter (Signed)
It wouldn't be due to any medication I prescribed her.  I had started her on nortriptyline which has since been discontinued.  It is likely recurrence of dizziness from her ongoing postconcussion symptoms, possibly triggered by the extended driving.  She should probably cut back on the driving and rest more until it resolves.

## 2017-05-23 ENCOUNTER — Telehealth: Payer: Self-pay | Admitting: Family Medicine

## 2017-05-23 ENCOUNTER — Telehealth: Payer: Self-pay

## 2017-05-23 NOTE — Telephone Encounter (Signed)
Pt called, stated not feeling better after resting like advsd by Dr Tomi Likens yesterday. Advsd her to contact Dr Tamala Julian for the dizziness per Dr Tomi Likens

## 2017-05-23 NOTE — Telephone Encounter (Signed)
States neurologist wants her to see Dr. Tamala Julian for dizziness.   She is going to take appt for Monday afternoon in case but her daughter has appt for CPE that day and really does not want to miss that.  Is requesting to be worked in a different time.

## 2017-05-24 NOTE — Telephone Encounter (Signed)
Called patient and she wanted to come in earlier on Monday. Told her that we do not have any openings until the 31st so she could reschedule on that day or keep the Monday time. She would like to keep Monday afternoon appointment.

## 2017-05-27 ENCOUNTER — Encounter: Payer: Self-pay | Admitting: Physical Therapy

## 2017-05-27 ENCOUNTER — Encounter: Payer: Self-pay | Admitting: Family Medicine

## 2017-05-27 ENCOUNTER — Ambulatory Visit (INDEPENDENT_AMBULATORY_CARE_PROVIDER_SITE_OTHER): Payer: No Typology Code available for payment source | Admitting: Family Medicine

## 2017-05-27 ENCOUNTER — Ambulatory Visit: Payer: No Typology Code available for payment source | Attending: Sports Medicine | Admitting: Physical Therapy

## 2017-05-27 DIAGNOSIS — M6281 Muscle weakness (generalized): Secondary | ICD-10-CM

## 2017-05-27 DIAGNOSIS — F0781 Postconcussional syndrome: Secondary | ICD-10-CM

## 2017-05-27 DIAGNOSIS — M62838 Other muscle spasm: Secondary | ICD-10-CM | POA: Diagnosis present

## 2017-05-27 DIAGNOSIS — M542 Cervicalgia: Secondary | ICD-10-CM | POA: Insufficient documentation

## 2017-05-27 DIAGNOSIS — R42 Dizziness and giddiness: Secondary | ICD-10-CM | POA: Diagnosis not present

## 2017-05-27 MED ORDER — NORTRIPTYLINE HCL 10 MG PO CAPS: 20.0000 mg | ORAL_CAPSULE | Freq: Every day | ORAL | 3 refills | Status: DC

## 2017-05-27 NOTE — Assessment & Plan Note (Signed)
Do not know patient's dizziness is secondary to the postconcussive syndrome. Patient does not want to take the Valium. Taking the nortriptyline no she would think could be beneficial and increase her to 20 mg nightly. 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 Still some very mild limitation in range of motion. Patient would not want any other medications for nausea or the dizziness itself. Patient will hold on physical therapy and follow-up with me again in 2 weeks worsening symptoms patient will go to the emergency room.

## 2017-05-27 NOTE — Patient Instructions (Signed)
Good to see you  I am sorry not a perfect answer Try the nortriptyline 20 mg at night Send me a message in 1 week and if not better lets consider effexor to help with some of the other problems as well B6 400mg  daily  See me again in 3 weeks to make sure you are doing better
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
## 2017-05-31 ENCOUNTER — Ambulatory Visit: Payer: No Typology Code available for payment source | Admitting: Neurology

## 2017-06-03 ENCOUNTER — Encounter: Payer: Self-pay | Admitting: Physical Therapy

## 2017-06-03 ENCOUNTER — Ambulatory Visit: Payer: No Typology Code available for payment source | Admitting: Physical Therapy

## 2017-06-03 DIAGNOSIS — M542 Cervicalgia: Secondary | ICD-10-CM

## 2017-06-03 DIAGNOSIS — M62838 Other muscle spasm: Secondary | ICD-10-CM

## 2017-06-03 DIAGNOSIS — M6281 Muscle weakness (generalized): Secondary | ICD-10-CM
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
## 2017-06-12 ENCOUNTER — Telehealth: Payer: Self-pay | Admitting: Family Medicine

## 2017-06-12 ENCOUNTER — Ambulatory Visit: Payer: No Typology Code available for payment source | Admitting: Physical Therapy

## 2017-06-12 NOTE — Telephone Encounter (Signed)
Sorry  Can increase nortriptyline to 30 mg and see if that helps.  Otherwise we can have her come in for a toradol shot to help break it.

## 2017-06-12 NOTE — Telephone Encounter (Signed)
Patient states she is on day 4 of a migraine. The medication was working at first but now it does not seem to be working. She would like to know if there is anything she can do. Please advise.

## 2017-06-12 NOTE — Telephone Encounter (Signed)
Discussed with pt

## 2017-06-19 ENCOUNTER — Ambulatory Visit (INDEPENDENT_AMBULATORY_CARE_PROVIDER_SITE_OTHER): Payer: No Typology Code available for payment source | Admitting: Family Medicine

## 2017-06-19 ENCOUNTER — Encounter: Payer: Self-pay | Admitting: Family Medicine

## 2017-06-19 ENCOUNTER — Ambulatory Visit: Payer: No Typology Code available for payment source | Attending: Sports Medicine | Admitting: Physical Therapy

## 2017-06-19 ENCOUNTER — Encounter: Payer: Self-pay | Admitting: Physical Therapy

## 2017-06-19 DIAGNOSIS — M542 Cervicalgia: Secondary | ICD-10-CM | POA: Diagnosis present

## 2017-06-19 DIAGNOSIS — M6281 Muscle weakness (generalized): Secondary | ICD-10-CM | POA: Diagnosis present

## 2017-06-19 DIAGNOSIS — M62838 Other muscle spasm: Secondary | ICD-10-CM | POA: Diagnosis present

## 2017-06-19 DIAGNOSIS — R42 Dizziness and giddiness: Secondary | ICD-10-CM

## 2017-06-19 MED ORDER — VENLAFAXINE HCL ER 37.5 MG PO CP24: 37.5000 mg | ORAL_CAPSULE | Freq: Every day | ORAL | 1 refills | Status: DC

## 2017-06-19 NOTE — Patient Instructions (Signed)
I know this is frustrating.  Effexor 37.5 mg daily and take it right away when you wake up  Nortriptyline at night 10 mg would be fine.  Lets watch for more kidneys stones and stay hydrated.  See me again in 4 weeks.
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
## 2017-06-19 NOTE — Therapy (Signed)
[place], [name], [address] Phone: [phone]   Fax:  [phone]  Physical Therapy Treatment and Discharge   Patient Details  Name: Stephanie Foley MRN: [id number] Date of Birth: 1966/03/26 Referring Provider: Dr. Edmonia Lynch   Encounter Date: 06/19/2017  PT End of Session - 06/19/17 0936    Visit Number  14    Date for PT Re-Evaluation  06/19/17    PT Start Time  0932    PT Stop Time  1028    PT Time Calculation (min)  56 min    Activity Tolerance  Patient tolerated treatment well    Behavior During Therapy  [name] for tasks assessed/performed       Past Medical History:  Diagnosis Date  . Cancer ([place])    basal cell, different areas of body  . Fibromyalgia   . Rosacea     Past Surgical History:  Procedure Laterality Date  . BASAL CELL CARCINOMA EXCISION  2009   times 5  . BREAST SURGERY  1999 or 2000   mastitis  . MOHS SURGERY     for basal cell (face)    There were no vitals filed for this visit.  Subjective Assessment - 06/19/17 0934    Subjective  Woke with Rt. sided neck pain and Rt. arm this AM due to sleeping on my side.  So random.  Saw Dr. Tamala Julian.  Noritriptyline isnt really helping, my migraines are increasing. Doing Pilates today.  Last day today.       Currently in Pain?  Yes    Pain Score  7     Pain Location  Neck neck and Rt. ant chest     Pain Orientation  Right    Pain Descriptors / Indicators  Tightness    Pain Type  Chronic pain    Pain Onset  More than a month ago    Pain Frequency  Intermittent           [facility] Adult PT Treatment/Exercise - 06/19/17 0001      Self-Care   Self-Care  Other Self-Care Comments    Other Self-Care Comments   community resources , HEP       Neck Exercises: Supine   Neck Retraction  10 reps      Shoulder Exercises: Supine   Horizontal ABduction  Strengthening;Both;15 reps    Theraband Level (Shoulder Horizontal ABduction)   Level 2 (Red)    External Rotation  Strengthening;Both;10 reps red       Shoulder Exercises: Sidelying   Other Sidelying Exercises  book openings in sidelying       Moist Heat Therapy   Number Minutes Moist Heat  10 Minutes    Moist Heat Location  Lumbar Spine      Manual Therapy   Joint Mobilization  scapular     Soft tissue mobilization  posterior cervicals, friction along suboccipital ridge, levator scap and rhomboids and subscapularis     Myofascial Release  Rt. scapular mobs and soft tissue compression     Passive ROM  all planes     Manual Traction  gentle traction in neutral and with lateral flexion (slight) to L              PT Education - 06/19/17 1028    Education provided  Yes    Education Details  multiple resources for fitness, massage     Methods  Explanation    Comprehension  Verbalized  understanding       PT Short Term Goals - 05/27/17 0913      PT SHORT TERM GOAL #1   Title  independent with initial HEP    Status  Achieved      PT SHORT TERM GOAL #2   Title  pain with daily activities decreased >/= 25%    Status  Achieved      PT SHORT TERM GOAL #3   Title  understand correct posture to decrease strain on cervical and lumbar spine , demo safe lifting and sitting posture    Status  Achieved      PT SHORT TERM GOAL #4   Title  ability to eat, read and sit with pain decreased >/= due to decreased muscle tightness    Status  Achieved      PT SHORT TERM GOAL #5   Title  pain is intermittent due to increased tolerance to activity    Status  Achieved        PT Long Term Goals - 06/19/17 0955      PT LONG TERM GOAL #1   Title  independent with HEP    Status  Achieved      PT LONG TERM GOAL #2   Title  Back pain decreased to none >75% of the time with sitting and standing for work activities.     Status  Achieved      PT LONG TERM GOAL #3   Title  Pt will be able to work > 4 hours per day with symptoms controlled.     Baseline  continues  to vary     Status  Partially Met      PT LONG TERM GOAL #4   Title  pt will transition to community/group Pilates or fitness classes without increasing pain in neck or low back.     Status  Achieved      PT LONG TERM GOAL #5   Title  fOTO score </= 25% limitation    Baseline  did not do on intake     Status  Deferred      PT LONG TERM GOAL #6   Title  carrying object and books with 75% greater ease due to increased strength and endurance    Status  Achieved            Plan - 06/19/17 0951    Clinical Impression Statement  Pt has made progress but plateuaed in progress. She has come a long way since her initial eval.  She has a goodHEP in place and plans to get regular Myofascial work for continues relief of stress and tension.     PT Next Visit Plan  DC    PT Home Exercise Plan  Lumbar stab and neck stretch, chin tuck, thoracic extension over chair, book opening, seated pelvic tilt, green band horiz abd and row     Consulted and Agree with Plan of Care  Patient       Patient will benefit from skilled therapeutic intervention in order to improve the following deficits and impairments:  Decreased range of motion, Increased fascial restricitons, Pain, Decreased endurance, Increased muscle spasms, Decreased strength, Decreased mobility, Decreased activity tolerance, Impaired flexibility, Impaired UE functional use, Hypomobility, Dizziness, Postural dysfunction  Visit Diagnosis: Cervicalgia  Muscle weakness (generalized)  Other muscle spasm     Problem List Patient Active Problem List   Diagnosis Date Noted  . Dizziness 05/27/2017  . Nonallopathic lesion of cervical region  03/04/2017  . Nonallopathic lesion of thoracic region 03/04/2017  . Nonallopathic lesion of lumbosacral region 03/04/2017  . Postconcussive syndrome 11/20/2016    PAA,JENNIFER 06/19/2017, 10:37 AM  [facility] [address] [place], [name],  [address] Phone: [phone]   Fax:  [phone]  Name: Stephanie Foley MRN: [id number] Date of Birth: 1966-05-14   PHYSICAL THERAPY DISCHARGE SUMMARY  Visits from Start of Care: 14  Current functional level related to goals / functional outcomes: Pt suffers from migraines, cont neck and trunk pain.  It varies a great deal.  Her low back has improved significantly.  Simple activities exacerbate Myofascial pain.  She also has dizziness from her post concussive syndrome.     Remaining deficits: UE strength is aprox.  4/5 throughout.  AROM in cervical spine WFL but pain end range.  Weak in core.  Headaches. Pain.    Education / Equipment: HEP, posture, lifting, energy conservation, pilates  Plan: Patient agrees to discharge.  Patient goals were met. Patient is being discharged due to meeting the stated rehab goals.  ?????    Raeford Razor, PT 06/19/17 10:39 AM Phone: [phone] Fax: [phone]

## 2017-06-20 ENCOUNTER — Telehealth: Payer: Self-pay | Admitting: Family Medicine

## 2017-06-20 MED ORDER — VENLAFAXINE HCL ER 37.5 MG PO CP24: 37.5000 mg | ORAL_CAPSULE | Freq: Every day | ORAL | 1 refills | Status: DC

## 2017-06-20 NOTE — Telephone Encounter (Signed)
Pt called again regarding this .

## 2017-06-20 NOTE — Telephone Encounter (Signed)
Pt called stating that the prescription for venlafaxine XR (EFFEXOR XR) 37.5 MG 24 hr capsule should be sent to [facility] in [place].

## 2017-06-20 NOTE — Telephone Encounter (Signed)
rx sent into [facility].

## 2017-06-21 ENCOUNTER — Encounter: Payer: No Typology Code available for payment source | Admitting: Physical Therapy

## 2017-06-24 ENCOUNTER — Encounter: Payer: No Typology Code available for payment source | Admitting: Physical Therapy

## 2017-07-01 ENCOUNTER — Encounter: Payer: Self-pay | Admitting: Neurology

## 2017-07-01 ENCOUNTER — Ambulatory Visit (INDEPENDENT_AMBULATORY_CARE_PROVIDER_SITE_OTHER): Payer: No Typology Code available for payment source | Admitting: Neurology

## 2017-07-01 VITALS — BP 90/62 | HR 87 | Ht 63.0 in | Wt 120.8 lb

## 2017-07-01 DIAGNOSIS — F0781 Postconcussional syndrome: Secondary | ICD-10-CM

## 2017-07-01 DIAGNOSIS — R42 Dizziness and giddiness: Secondary | ICD-10-CM | POA: Diagnosis not present

## 2017-07-01 DIAGNOSIS — G43009 Migraine without aura, not intractable, without status migrainosus: Secondary | ICD-10-CM
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
## 2017-07-01 NOTE — Patient Instructions (Signed)
1.  Start the venlafaxine XR 37.5mg  daily in the morning.  Contact me in 4 weeks with update (prior to needing refill).  If you are not doing well, we will increase dose 2.  Continue nortriptyline 20mg  at bedtime 3.  Use sumatriptan for migraine attacks as directed. 4.  Follow up in 3 months.

## 2017-07-17 ENCOUNTER — Ambulatory Visit: Payer: No Typology Code available for payment source | Admitting: Family Medicine

## 2017-07-23 ENCOUNTER — Ambulatory Visit: Payer: Self-pay

## 2017-07-23 NOTE — Telephone Encounter (Signed)
Discussed with pt

## 2017-07-23 NOTE — Telephone Encounter (Signed)
Pease stop the medicine and we can discuss another medicine at follow  Up
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
## 2017-08-05 ENCOUNTER — Ambulatory Visit (INDEPENDENT_AMBULATORY_CARE_PROVIDER_SITE_OTHER): Payer: No Typology Code available for payment source | Admitting: Family Medicine

## 2017-08-05 ENCOUNTER — Encounter: Payer: Self-pay | Admitting: Family Medicine

## 2017-08-05 DIAGNOSIS — F0781 Postconcussional syndrome: Secondary | ICD-10-CM | POA: Diagnosis not present

## 2017-08-05 NOTE — Patient Instructions (Signed)
Good to see you  Made my new year!!! Get back to the regular activity  Look at [facility] See me again in 3 months for hopefully last visit!

## 2017-08-05 NOTE — Assessment & Plan Note (Signed)
Patient is doing remarkably well.  No change in management at this time.  Patient has not been able to tolerate the Effexor.  Has been doing well with the nortriptyline.  Would like patient to come back in 3 months and then we will hopefully start to titrate off of the nortriptyline.

## 2017-08-21 ENCOUNTER — Other Ambulatory Visit: Payer: Self-pay | Admitting: Neurology

## 2017-10-07 ENCOUNTER — Encounter: Payer: Self-pay | Admitting: Family Medicine

## 2017-10-16 ENCOUNTER — Ambulatory Visit: Payer: No Typology Code available for payment source | Admitting: Neurology

## 2017-10-17 ENCOUNTER — Encounter: Payer: Self-pay | Admitting: Family Medicine

## 2017-10-18 MED ORDER — NORTRIPTYLINE HCL 10 MG PO CAPS: 20.0000 mg | ORAL_CAPSULE | Freq: Every day | ORAL | 1 refills | Status: DC

## 2017-10-23 MED ORDER — NORTRIPTYLINE HCL 10 MG PO CAPS: 20.0000 mg | ORAL_CAPSULE | Freq: Every day | ORAL | 1 refills | Status: DC

## 2017-10-23 NOTE — Addendum Note (Signed)
Addended by: Douglass Rivers T on: 10/23/2017 10:13 AM   Modules accepted: Orders

## 2017-11-08 ENCOUNTER — Ambulatory Visit: Payer: No Typology Code available for payment source | Admitting: Family Medicine

## 2017-11-18 ENCOUNTER — Encounter: Payer: Self-pay | Admitting: Family Medicine

## 2017-11-29 ENCOUNTER — Other Ambulatory Visit: Payer: Self-pay | Admitting: Neurology

## 2017-12-02 ENCOUNTER — Ambulatory Visit: Payer: No Typology Code available for payment source | Admitting: Neurology

## 2017-12-03 ENCOUNTER — Ambulatory Visit: Payer: No Typology Code available for payment source | Admitting: Neurology

## 2017-12-03 ENCOUNTER — Encounter: Payer: Self-pay | Admitting: Family Medicine

## 2017-12-04 ENCOUNTER — Telehealth: Payer: Self-pay

## 2017-12-04 NOTE — Telephone Encounter (Signed)
Called patient to set up another visit as she had to cancel her appointment on Friday, May 3rd. Left message for her to call back.

## 2017-12-06 ENCOUNTER — Ambulatory Visit: Payer: No Typology Code available for payment source | Admitting: Family Medicine

## 2017-12-10 NOTE — Telephone Encounter (Signed)
Pt scheduled for 5.15.19.

## 2017-12-10 NOTE — Telephone Encounter (Signed)
Patient called back, tried to reach [facility] @ [phone].

## 2017-12-16 ENCOUNTER — Encounter: Payer: Self-pay | Admitting: Family Medicine

## 2017-12-17 NOTE — Progress Notes (Signed)
[name] [address], [place] [address] Phone: [phone] Subjective:    I'm seeing this patient by the request  of:    CC: Headaches follow-up.  [id number]  Stephanie Foley is a 52 y.o. female coming in with complaint of rash.   Onset- Chronic (May 3rd it became painful) Location- Face Character- Burning  Patient is concerned that this rash could being from the amitriptyline.  Has seen other providers including a dermatologist.  Has made some improvement since she has stopped the amitriptyline.  Patient states that the headaches are no longer giving her any difficulty.  Not having any problems at all.  Feels that the concussion is completely resolved.      Past Medical History:  Diagnosis Date  . Cancer ([place])    basal cell, different areas of body  . Fibromyalgia   . Rosacea    Past Surgical History:  Procedure Laterality Date  . BASAL CELL CARCINOMA EXCISION  2009   times 5  . BREAST SURGERY  1999 or 2000   mastitis  . MOHS SURGERY     for basal cell (face)   Social History   Socioeconomic History  . Marital status: Married    Spouse name: Not on file  . Number of children: Not on file  . Years of education: Not on file  . Highest education level: Not on file  Occupational History  . Not on file  Social Needs  . Financial resource strain: Not on file  . Food insecurity:    Worry: Not on file    Inability: Not on file  . Transportation needs:    Medical: Not on file    Non-medical: Not on file  Tobacco Use  . Smoking status: Never Smoker  . Smokeless tobacco: Never Used  Substance and Sexual Activity  . Alcohol use: No  . Drug use: No  . Sexual activity: Yes    Partners: Male    Birth control/protection: Condom  Lifestyle  . Physical activity:    Days per week: Not on file    Minutes per session: Not on file  . Stress: Not on file  Relationships  . Social connections:    Talks on phone: Not on file      Gets together: Not on file    Attends religious service: Not on file    Active member of club or organization: Not on file    Attends meetings of clubs or organizations: Not on file    Relationship status: Not on file  Other Topics Concern  . Not on file  Social History Narrative  . Not on file   Allergies  Allergen Reactions  . Amoxicillin     diarrhea   Family History  Problem Relation Age of Onset  . Hypertension Mother   . Hypertension Father   . Stroke Father   . Hypertension Brother      Past medical history, social, surgical and family history all reviewed in electronic medical record.  No pertanent information unless stated regarding to the chief complaint.   Review of Systems:Review of systems updated and as accurate as of 12/18/17  No headache, visual changes, nausea, vomiting, diarrhea, constipation, dizziness, abdominal pain,  fevers, chills, night sweats, weight loss, swollen lymph nodes, body aches, joint swelling, muscle aches, chest pain, shortness of breath, mood changes.  Positive rash  Objective  Blood pressure 104/70, pulse 91, height 5\' 3"  (1.6 m), weight 120  lb (54.4 kg), SpO2 94 %. Systems examined below as of 12/18/17   General: No apparent distress alert and oriented x3 mood and affect normal, dressed appropriately.  HEENT: Pupils equal, extraocular movements intact  Respiratory: Patient's speak in full sentences and does not appear short of breath  Cardiovascular: No lower extremity edema, non tender, no erythema  Skin: Patient has a distribution that appears to be somewhat of a pustular rosacea Abdomen: Soft nontender  Neuro: Cranial nerves II through XII are intact, neurovascularly intact in all extremities with 2+ DTRs and 2+ pulses.  Lymph: No lymphadenopathy of posterior or anterior cervical chain or axillae bilaterally.  Gait normal with good balance and coordination.  MSK:  Non tender with full range of motion and good stability and  symmetric strength and tone of shoulders, elbows, wrist, hip, knee and ankles bilaterally.     Impression and Recommendations:     This case required medical decision making of moderate complexity.      Note: This dictation was prepared with Dragon dictation along with smaller phrase technology. Any transcriptional errors that result from this process are unintentional.

## 2017-12-18 ENCOUNTER — Telehealth: Payer: Self-pay | Admitting: Family Medicine

## 2017-12-18 ENCOUNTER — Encounter

## 2017-12-18 ENCOUNTER — Ambulatory Visit: Payer: No Typology Code available for payment source | Admitting: Family Medicine

## 2017-12-18 ENCOUNTER — Encounter: Payer: Self-pay | Admitting: Family Medicine

## 2017-12-18 DIAGNOSIS — F0781 Postconcussional syndrome: Secondary | ICD-10-CM | POA: Diagnosis not present

## 2017-12-18 DIAGNOSIS — R21 Rash and other nonspecific skin eruption: Secondary | ICD-10-CM | POA: Diagnosis not present

## 2017-12-18 MED ORDER — OMEPRAZOLE 40 MG PO CPDR: 40.0000 mg | DELAYED_RELEASE_CAPSULE | Freq: Every day | ORAL | 11 refills | Status: DC

## 2017-12-18 MED ORDER — DOXYCYCLINE HYCLATE 100 MG PO TABS: 100.0000 mg | ORAL_TABLET | Freq: Two times a day (BID) | ORAL | 0 refills | Status: AC

## 2017-12-18 NOTE — Telephone Encounter (Unsigned)
Copied from [place] [id number]. Topic: Quick Communication - Rx Refill/Question >> Dec 18, 2017  5:52 PM Neva Seat wrote: omeprazole (PRILOSEC) 40 MG capsule  Needing to verify directions  [facility]  [phone]

## 2017-12-18 NOTE — Telephone Encounter (Signed)
Copied from [place] [id number]. Topic: Quick Communication - Rx Refill/Question >> Dec 18, 2017  4:31 PM Tye [place] wrote: Medication: omeprazole (PRILOSEC) 40 MG capsule [[id number]]   Pharmacy called and they need the medication above clarified on how to take, call pharmacy

## 2017-12-18 NOTE — Telephone Encounter (Signed)
See 2nd telephone encounter  

## 2017-12-18 NOTE — Patient Instructions (Signed)
good to see you  Overall I think no association with the concussion or CRPS Try Doxycycline 2 times a day for 10 days Prilosec 1 time daily  Send me a message in 2 weeks and tell me how we are doing.  Epicream daily as well could help

## 2017-12-18 NOTE — Assessment & Plan Note (Signed)
I believe the patient is completely resolved at this time.  Maximal medical improvement.  No signs of any of the reflex sympathetic dystrophy

## 2017-12-18 NOTE — Assessment & Plan Note (Signed)
Patient does have a facial rash I do believe that this is more of a rosacea.  Patient given a trial of topical anti-inflammatories, patient feels that the nortriptyline contributing and discussed with her to discontinue.  Encourage her to follow-up with a dermatologist.  Follow-up with me as needed

## 2017-12-18 NOTE — Telephone Encounter (Signed)
Omeprazole prescription sent in today and the instructions need to be clarified by the pharmacy, frequency is daily but instructions say take a breakfast and dinnertime.

## 2017-12-19 NOTE — Telephone Encounter (Signed)
Pharmacy made aware pt should take omeprazole 40mg  qd.

## 2017-12-25 ENCOUNTER — Emergency Department (HOSPITAL_COMMUNITY)
Admission: EM | Admit: 2017-12-25 | Discharge: 2017-12-25 | Disposition: A | Discharge status: Home / Self Care | Payer: No Typology Code available for payment source | Attending: Emergency Medicine | Admitting: Emergency Medicine

## 2017-12-25 ENCOUNTER — Other Ambulatory Visit: Payer: Self-pay

## 2017-12-25 ENCOUNTER — Encounter (HOSPITAL_COMMUNITY): Payer: Self-pay | Admitting: Emergency Medicine

## 2017-12-25 DIAGNOSIS — K209 Esophagitis, unspecified without bleeding: Secondary | ICD-10-CM

## 2017-12-25 DIAGNOSIS — L719 Rosacea, unspecified: Secondary | ICD-10-CM | POA: Insufficient documentation

## 2017-12-25 DIAGNOSIS — Z79899 Other long term (current) drug therapy: Secondary | ICD-10-CM | POA: Insufficient documentation

## 2017-12-25 DIAGNOSIS — K224 Dyskinesia of esophagus: Secondary | ICD-10-CM | POA: Diagnosis present

## 2017-12-25 MED ORDER — GI COCKTAIL ~~LOC~~
30.0000 mL | Freq: Once | ORAL | Status: AC
  Administered 2017-12-25: 30 mL via ORAL
  Filled 2017-12-25: qty 30

## 2017-12-25 NOTE — ED Notes (Signed)
Bed: WTR5 Expected date:  Expected time:  Means of arrival:  Comments: 

## 2017-12-25 NOTE — ED Provider Notes (Signed)
[facility] DEPT Provider Note   CSN: [id number] Arrival date & time: 12/25/17  2774     History   Chief Complaint Chief Complaint  Patient presents with  . Rosacea    HPI Stephanie Foley is a 52 y.o. female.  HPI Patient complains of esophageal spasm with pain radiatingo her jaw onsetthis past week. She's been on minocycline for a few weeks for rosacea, prescribed by her dermatologist. She vomited one time yesterday. Other associated symptoms include tingling all over her body no shortness of breath. Pain is worse with swallowing. No other associated symptoms. She was prescribed Prilosec by her PMDthis past week for same complaint she's taken 1 dose thus far. She reports rosacea rash is improving Past Medical History:  Diagnosis Date  . Cancer ([place])    basal cell, different areas of body  . Fibromyalgia   . Rosacea     Patient Active Problem List   Diagnosis Date Noted  . Facial rash 12/18/2017  . Dizziness 05/27/2017  . Nonallopathic lesion of cervical region 03/04/2017  . Nonallopathic lesion of thoracic region 03/04/2017  . Nonallopathic lesion of lumbosacral region 03/04/2017  . Postconcussive syndrome 11/20/2016    Past Surgical History:  Procedure Laterality Date  . BASAL CELL CARCINOMA EXCISION  2009   times 5  . BREAST SURGERY  1999 or 2000   mastitis  . MOHS SURGERY     for basal cell (face)     OB History    Gravida  6   Para  3   Term  3   Preterm      AB  3   Living  3     SAB  3   TAB      Ectopic      Multiple      Live Births  3            Home Medications    Prior to Admission medications   Medication Sig Start Date End Date Taking? Authorizing Provider  Ascorbic Acid (VITAMIN C PO) Take 1 tablet by mouth daily.    Yes [provider]  B Complex Vitamins (B COMPLEX PO) Take 1 tablet by mouth daily.    Yes [provider]  CALCIUM PO Take 1 tablet by mouth 2 (two) times  daily.    Yes [provider]  cholecalciferol (VITAMIN D) 1000 units tablet Take 1,000 Units by mouth daily.   Yes [provider]  hydrocortisone 2.5 % cream Apply 1 application topically daily. 12/11/17  Yes [provider]  Hydrocortisone Butyrate 0.1 % OINT Apply 1 application topically daily. 12/09/17  Yes [provider]  ibuprofen (ADVIL,MOTRIN) 200 MG tablet Take 400 mg by mouth every 6 (six) hours as needed for headache.   Yes [provider]  metroNIDAZOLE (METROCREAM) 0.75 % cream Apply 1 application topically 2 (two) times daily. 12/11/17  Yes [provider]  minocycline (DYNACIN) 100 MG tablet Take 100 mg by mouth daily. 12/04/17  Yes [provider]  Multiple Vitamins-Minerals (MULTIVITAMIN PO) Take 1 tablet by mouth daily.    Yes [provider]  nortriptyline (PAMELOR) 10 MG capsule Take 10 mg by mouth daily.   Yes [provider]  Omega-3 Fatty Acids (FISH OIL PO) Take 1 capsule by mouth daily.    Yes [provider]  omeprazole (PRILOSEC) 40 MG capsule Take 1 capsule (40 mg total) by mouth daily. Take in the morning and  at dinnertime. 12/18/17  Yes Hulan Saas M, DO  SUMAtriptan (IMITREX) 100 MG tablet TAKE 1 TABLET BY MOUTH AT ONSET OF HEADACHE MAY REPEAT IN 2HRS **MAX 2/24HRS** 11/29/17  Yes Jaffe, Adam R, DO  TURMERIC PO Take 2 capsules by mouth daily.   Yes [provider]  doxycycline (VIBRA-TABS) 100 MG tablet Take 1 tablet (100 mg total) by mouth 2 (two) times daily for 10 days. Patient not taking: Reported on 12/25/2017 12/18/17 12/28/17  Lyndal Pulley, DO  minocycline  Family History Family History  Problem Relation Age of Onset  . Hypertension Mother   . Hypertension Father   . Stroke Father   . Hypertension Brother     Social History Social History   Tobacco Use  . Smoking status: Never Smoker  . Smokeless tobacco: Never Used  Substance Use Topics  . Alcohol use:  No  . Drug use: No     Allergies   Amoxicillin   Review of Systems Review of Systems  Cardiovascular: Positive for chest pain.       Pain in chest with swallowing  Gastrointestinal: Positive for vomiting.       Vomiting one time  Skin: Positive for rash.  All other systems reviewed and are negative.    Physical Exam Updated Vital Signs BP 129/80 (BP Location: Right Arm)   Pulse 68   Temp 97.7 F (36.5 C) (Oral)   Resp 20   Ht 5\' 3"  (1.6 m)   Wt 54.4 kg (120 lb)   SpO2 98%   BMI 21.26 kg/m   Physical Exam  Constitutional: She appears well-developed and well-nourished.  HENT:  Head: Normocephalic and atraumatic.  No mucosal lesion  Eyes: Pupils are equal, round, and reactive to light. Conjunctivae are normal.  Neck: Neck supple. No tracheal deviation present. No thyromegaly present.  Cardiovascular: Normal rate and regular rhythm.  No murmur heard. Pulmonary/Chest: Effort normal and breath sounds normal.  Abdominal: Soft. Bowel sounds are normal. She exhibits no distension. There is no tenderness.  Musculoskeletal: Normal range of motion. She exhibits no edema or tenderness.  Neurological: She is alert. Coordination normal.  Skin: Skin is warm and dry. No rash noted.  Erythematous rash on skin consistent with rosacea  Psychiatric: She has a normal mood and affect.  Nursing note and vitals reviewed.    ED Treatments / Results  Labs (all labs ordered are listed, but only abnormal results are displayed) Labs Reviewed - No data to display  EKG None  Radiology No results found.  Procedures Procedures (including critical care time)  Medications Ordered in ED Medications - No data to display  Patient given water to drink which she states caused burning of her esophagus. No vomiting Initial Impression / Assessment and Plan / ED Course  I have reviewed the triage vital signs and the nursing notes.  Pertinent labs & imaging results that were available  during my care of the patient were reviewed by me and considered in my medical decision making (see chart for details).    11:05 AM feels improved after treatment with GI cocktail.  Symptoms consistent with esophagitis. Likely secondary to minocycline. I had conversation with patient regarding stopping minocycline versus continuing. She wants to stop minocycline. She has an appointment with her dermatologist in 2 days. I felt okay to stop minocycline. Suggest Maalox 2 tablespoons after meals and at bedtime. Continue Prilosec. Bland diet. Encourage oral hydration  Final Clinical Impressions(s) / ED Diagnoses   Final  diagnoses:  None  diagnosis esophagitis  ED Discharge Orders    None       Orlie Dakin, MD 12/25/17 1108

## 2017-12-25 NOTE — ED Triage Notes (Signed)
Pt complaint of right jaw pain/swelling and nausea post diagnosis and current antibiotic treatment for rosacea.

## 2017-12-25 NOTE — Discharge Instructions (Addendum)
It is okay to stop minocycline. Keep your scheduled appointment with your dermatologist in 2 days. Take Maalox 2 tablespoons after meals and at bedtime. Continue Prilosec as directed.Make sure that you drink at least six 8 ounce glasses of water or Gatorade each day in order to stay well-hydrated.  Stay on a bland diet such as scrambled eggs, rice, toast. Avoid spicy foods

## 2017-12-26 ENCOUNTER — Encounter: Payer: Self-pay | Admitting: Family Medicine

## 2017-12-31 ENCOUNTER — Telehealth: Payer: Self-pay | Admitting: Neurology

## 2017-12-31 NOTE — Telephone Encounter (Signed)
FYI  Patient states that Dr Tamala Julian released her and migraines are gone

## 2018-01-13 ENCOUNTER — Ambulatory Visit: Payer: No Typology Code available for payment source | Admitting: Neurology

## 2018-01-21 ENCOUNTER — Other Ambulatory Visit: Payer: Self-pay | Admitting: Neurology

## 2018-04-17 ENCOUNTER — Ambulatory Visit: Payer: No Typology Code available for payment source | Admitting: Certified Nurse Midwife

## 2018-04-17 ENCOUNTER — Other Ambulatory Visit: Payer: Self-pay

## 2018-04-17 ENCOUNTER — Encounter: Payer: Self-pay | Admitting: Certified Nurse Midwife

## 2018-04-17 ENCOUNTER — Other Ambulatory Visit (HOSPITAL_COMMUNITY)
Admission: RE | Admit: 2018-04-17 | Discharge: 2018-04-17 | Disposition: A | Discharge status: Home / Self Care | Payer: No Typology Code available for payment source | Source: Ambulatory Visit | Attending: Obstetrics & Gynecology | Admitting: Obstetrics & Gynecology

## 2018-04-17 VITALS — BP 90/60 | HR 64 | Resp 16 | Ht 63.0 in | Wt 122.0 lb

## 2018-04-17 DIAGNOSIS — Z124 Encounter for screening for malignant neoplasm of cervix: Secondary | ICD-10-CM | POA: Insufficient documentation

## 2018-04-17 DIAGNOSIS — N912 Amenorrhea, unspecified: Secondary | ICD-10-CM

## 2018-04-17 DIAGNOSIS — N951 Menopausal and female climacteric states: Secondary | ICD-10-CM

## 2018-04-17 DIAGNOSIS — Z0000 Encounter for general adult medical examination without abnormal findings: Secondary | ICD-10-CM | POA: Diagnosis not present

## 2018-04-17 DIAGNOSIS — Z01419 Encounter for gynecological examination (general) (routine) without abnormal findings: Secondary | ICD-10-CM

## 2018-04-17 LAB — POCT URINE PREGNANCY: PREG TEST UR: NEGATIVE

## 2018-04-17 NOTE — Progress Notes (Signed)
52 y.o. [id number] Married  Caucasian Fe here to re-establish gyn care and for annual exam. Perimenopausal with period changes in the past year. Having regular periods, LMP was 03/03/18. Duration change to two heavy days and then very light. Feels like period maybe starting today(due) with increase discharge. Having some skin changes with drier vaginal area and episode of hot flashes that have stopped. Sees Dr. Kenton Kingfisher as needed and for Prilosec and Imitrex management. Last migraine was several months ago, decreasing in occurrence. Has not had mammogram or pap smear since last visit in 2013. Does SBE and no changes. Denies any other health issues today. Declines labs except pap smear. Children now 20, 17, 12(delivered her)!  Patient's last menstrual period was 03/03/2018.          Sexually active: Yes.    The current method of family planning is condoms all the time.    Exercising: Yes.    walking & yoga Smoker:  no  Review of Systems  Constitutional: Negative.   HENT: Negative.   Eyes: Negative.   Respiratory: Negative.   Cardiovascular: Negative.   Gastrointestinal: Negative.   Genitourinary:       Vaginal dryness, hot flashes, missed menstrual cycle  Musculoskeletal: Negative.   Skin: Negative.   Neurological: Negative.   Endo/Heme/Allergies: Negative.   Psychiatric/Behavioral: Negative.     Health Maintenance: Pap:  06-24-12 neg HPV HR neg History of Abnormal Pap: no MMG:  06-13-15 bilateral & left breast u/s neg Self Breast exams: no Colonoscopy:  2003 BMD:   none TDaP:  2018 Shingles: not done Pneumonia: not done Hep C and HIV: not done Labs: as needed.   reports that she has never smoked. She has never used smokeless tobacco. She reports that she does not drink alcohol or use drugs.  Past Medical History:  Diagnosis Date  . Cancer ([facility])    basal cell, different areas of body  . Fibromyalgia   . Rosacea     Past Surgical History:  Procedure Laterality Date  . BASAL  CELL CARCINOMA EXCISION  2009   times 5  . BREAST SURGERY  1999 or 2000   mastitis  . MOHS SURGERY     for basal cell (face)    Current Outpatient Medications  Medication Sig Dispense Refill  . Ascorbic Acid (VITAMIN C PO) Take 1 tablet by mouth daily.     . B Complex Vitamins (B COMPLEX PO) Take 1 tablet by mouth daily.     Marland Kitchen CALCIUM PO Take 1 tablet by mouth 2 (two) times daily.     . cholecalciferol (VITAMIN D) 1000 units tablet Take 1,000 Units by mouth daily.    . hydrocortisone 2.5 % cream Apply 1 application topically daily.  2  . ibuprofen (ADVIL,MOTRIN) 200 MG tablet Take 400 mg by mouth every 6 (six) hours as needed for headache.    . Multiple Vitamins-Minerals (MULTIVITAMIN PO) Take 1 tablet by mouth daily.     . Omega-3 Fatty Acids (FISH OIL PO) Take 1 capsule by mouth.     Marland Kitchen omeprazole (PRILOSEC) 40 MG capsule Take 1 capsule (40 mg total) by mouth daily. Take in the morning and at dinnertime. (Patient taking differently: Take 40 mg by mouth as needed. Take in the morning and at dinnertime.) 30 capsule 11  . SUMAtriptan (IMITREX) 100 MG tablet TAKE 1 TABLET BY MOUTH AT ONSET OF HEADACHE MAY REPEAT IN 2HRS **MAX 2/24HRS** 10 tablet 0  . TURMERIC PO Take 2 capsules  by mouth.      No current facility-administered medications for this visit.     Family History  Problem Relation Age of Onset  . Hypertension Mother   . Hypertension Father   . Stroke Father   . Hypertension Brother     ROS:  Pertinent items are noted in HPI.  Otherwise, a comprehensive ROS was negative.  Exam:   BP 90/60   Pulse 64   Resp 16   Ht 5\' 3"  (1.6 m)   Wt 122 lb (55.3 kg)   LMP 03/03/2018   BMI 21.61 kg/m  Height: 5\' 3"  (160 cm) Ht Readings from Last 3 Encounters:  04/17/18 5\' 3"  (1.6 m)  12/25/17 5\' 3"  (1.6 m)  12/18/17 5\' 3"  (1.6 m)    General appearance: alert, cooperative and appears stated age Head: Normocephalic, without obvious abnormality, atraumatic Neck: no adenopathy,  supple, symmetrical, trachea midline and thyroid normal to inspection and palpation Lungs: clear to auscultation bilaterally Breasts: normal appearance, no masses or tenderness, No nipple retraction or dimpling, No nipple discharge or bleeding, No axillary or supraclavicular adenopathy Heart: regular rate and rhythm Abdomen: soft, non-tender; no masses,  no organomegaly Extremities: extremities normal, atraumatic, no cyanosis or edema Skin: Skin color, texture, turgor normal. No rashes or lesions Lymph nodes: Cervical, supraclavicular, and axillary nodes normal. No abnormal inguinal nodes palpated Neurologic: Grossly normal   Pelvic: External genitalia:  no lesions              Urethra:  normal appearing urethra with no masses, tenderness or lesions              Bartholin's and Skene's: normal                 Vagina: normal appearing vagina with normal color and discharge, no lesions              Cervix: multiparous appearance, no cervical motion tenderness and no lesions              Pap taken: Yes.   Bimanual Exam:  Uterus:  normal size, contour, position, consistency, mobility, non-tender and anteverted              Adnexa: normal adnexa and no mass, fullness, tenderness               Rectovaginal: Confirms               Anus:  normal sphincter tone, no lesions  Chaperone present: yes  A:  Well Woman with normal exam  Perimenopausal vs cycle change  History of Migraines, less frequent now  Mammogram and colonoscopy due  Screening labs declined  P:   Reviewed health and wellness pertinent to exam  Discussed perimenopausal/cycle changes. Etiology of perimenopause discussed with bleeding expectations. Discussed thyroid, weight change and age cycles can change.Discussed monitoring and if no period in 3 months needs to advise, due to concern with hyperplasia and heavy bleeding. Given printed information and menses calendar to record. Questions answered at length.  Discussed warning  signs with migraines and needs to advise if occurring.  Stressed importance of mammogram screening. Given information for [facility] which is where she went last to schedule.  Risks/benefits of colonoscopy discussed and Cologard information. Patient will advise if desires help with scheduling or Cologard.  Recommend screening labs for cholesterol, Vitamin D, thyroid. Declines at this time.  Pap smear: yes  counseled on breast self exam, mammography screening, feminine hygiene, adequate intake of  calcium and vitamin D, diet and exercise return annually or prn  An After Visit Summary was printed and given to the patient.

## 2018-04-17 NOTE — Patient Instructions (Signed)

## 2018-04-21 LAB — CYTOLOGY - PAP
DIAGNOSIS: NEGATIVE
HPV (WINDOPATH): NOT DETECTED

## 2018-11-20 ENCOUNTER — Encounter: Payer: Self-pay | Admitting: Family Medicine

## 2019-02-13 ENCOUNTER — Telehealth: Payer: Self-pay | Admitting: Certified Nurse Midwife

## 2019-02-13 NOTE — Telephone Encounter (Signed)
Patient want to speak with the nurse about treatment options for hot flashes.

## 2019-02-13 NOTE — Telephone Encounter (Signed)
Return call to patient. Unable to leave message. Voice mailbox is full.

## 2019-09-07 ENCOUNTER — Ambulatory Visit (INDEPENDENT_AMBULATORY_CARE_PROVIDER_SITE_OTHER): Payer: 59 | Admitting: Certified Nurse Midwife

## 2019-09-07 ENCOUNTER — Encounter: Payer: Self-pay | Admitting: Certified Nurse Midwife

## 2019-09-07 ENCOUNTER — Other Ambulatory Visit: Payer: Self-pay

## 2019-09-07 VITALS — BP 120/80 | HR 76 | Temp 97.1°F | Resp 16 | Ht 62.5 in | Wt 129.0 lb

## 2019-09-07 DIAGNOSIS — Z01419 Encounter for gynecological examination (general) (routine) without abnormal findings: Secondary | ICD-10-CM

## 2019-09-07 DIAGNOSIS — N951 Menopausal and female climacteric states: Secondary | ICD-10-CM | POA: Diagnosis not present

## 2019-09-07 NOTE — Patient Instructions (Signed)
EXERCISE AND DIET:  We recommended that you start or continue a regular exercise program for good health. Regular exercise means any activity that makes your heart beat faster and makes you sweat.  We recommend exercising at least 30 minutes per day at least 3 days a week, preferably 4 or 5.  We also recommend a diet low in fat and sugar.  Inactivity, poor dietary choices and obesity can cause diabetes, heart attack, stroke, and kidney damage, among others.    ALCOHOL AND SMOKING:  Women should limit their alcohol intake to no more than 7 drinks/beers/glasses of wine (combined, not each!) per week. Moderation of alcohol intake to this level decreases your risk of breast cancer and liver damage. And of course, no recreational drugs are part of a healthy lifestyle.  And absolutely no smoking or even second hand smoke. Most people know smoking can cause heart and lung diseases, but did you know it also contributes to weakening of your bones? Aging of your skin?  Yellowing of your teeth and nails?  CALCIUM AND VITAMIN D:  Adequate intake of calcium and Vitamin D are recommended.  The recommendations for exact amounts of these supplements seem to change often, but generally speaking 600 mg of calcium (either carbonate or citrate) and 800 units of Vitamin D per day seems prudent. Certain women may benefit from higher intake of Vitamin D.  If you are among these women, your doctor will have told you during your visit.    PAP SMEARS:  Pap smears, to check for cervical cancer or precancers,  have traditionally been done yearly, although recent scientific advances have shown that most women can have pap smears less often.  However, every woman still should have a physical exam from her gynecologist every year. It will include a breast check, inspection of the vulva and vagina to check for abnormal growths or skin changes, a visual exam of the cervix, and then an exam to evaluate the size and shape of the uterus and  ovaries.  And after 52 years of age, a rectal exam is indicated to check for rectal cancers. We will also provide age appropriate advice regarding health maintenance, like when you should have certain vaccines, screening for sexually transmitted diseases, bone density testing, colonoscopy, mammograms, etc.   MAMMOGRAMS:  All women over 40 years old should have a yearly mammogram. Many facilities now offer a "3D" mammogram, which may cost around $50 extra out of pocket. If possible,  we recommend you accept the option to have the 3D mammogram performed.  It both reduces the number of women who will be called back for extra views which then turn out to be normal, and it is better than the routine mammogram at detecting truly abnormal areas.    COLONOSCOPY:  Colonoscopy to screen for colon cancer is recommended for all women at age 50.  We know, you hate the idea of the prep.  We agree, BUT, having colon cancer and not knowing it is worse!!  Colon cancer so often starts as a polyp that can be seen and removed at colonscopy, which can quite literally save your life!  And if your first colonoscopy is normal and you have no family history of colon cancer, most women don't have to have it again for 10 years.  Once every ten years, you can do something that may end up saving your life, right?  We will be happy to help you get it scheduled when you are ready.    Be sure to check your insurance coverage so you understand how much it will cost.  It may be covered as a preventative service at no cost, but you should check your particular policy.      Perimenopause  Perimenopause is the normal time of life before and after menstrual periods stop completely (menopause). Perimenopause can begin 2-8 years before menopause, and it usually lasts for 1 year after menopause. During perimenopause, the ovaries may or may not produce an egg. What are the causes? This condition is caused by a natural change in hormone levels that  happens as you get older. What increases the risk? This condition is more likely to start at an earlier age if you have certain medical conditions or treatments, including:  A tumor of the pituitary gland in the brain.  A disease that affects the ovaries and hormone production.  Radiation treatment for cancer.  Certain cancer treatments, such as chemotherapy or hormone (anti-estrogen) therapy.  Heavy smoking and excessive alcohol use.  Family history of early menopause. What are the signs or symptoms? Perimenopausal changes affect each woman differently. Symptoms of this condition may include:  Hot flashes.  Night sweats.  Irregular menstrual periods.  Decreased sex drive.  Vaginal dryness.  Headaches.  Mood swings.  Depression.  Memory problems or trouble concentrating.  Irritability.  Tiredness.  Weight gain.  Anxiety.  Trouble getting pregnant. How is this diagnosed? This condition is diagnosed based on your medical history, a physical exam, your age, your menstrual history, and your symptoms. Hormone tests may also be done. How is this treated? In some cases, no treatment is needed. You and your health care provider should make a decision together about whether treatment is necessary. Treatment will be based on your individual condition and preferences. Various treatments are available, such as:  Menopausal hormone therapy (MHT).  Medicines to treat specific symptoms.  Acupuncture.  Vitamin or herbal supplements. Before starting treatment, make sure to let your health care provider know if you have a personal or family history of:  Heart disease.  Breast cancer.  Blood clots.  Diabetes.  Osteoporosis. Follow these instructions at home: Lifestyle  Do not use any products that contain nicotine or tobacco, such as cigarettes and e-cigarettes. If you need help quitting, ask your health care provider.  Eat a balanced diet that includes fresh  fruits and vegetables, whole grains, soybeans, eggs, lean meat, and low-fat dairy.  Get at least 30 minutes of physical activity on 5 or more days each week.  Avoid alcoholic and caffeinated beverages, as well as spicy foods. This may help prevent hot flashes.  Get 7-8 hours of sleep each night.  Dress in layers that can be removed to help you manage hot flashes.  Find ways to manage stress, such as deep breathing, meditation, or journaling. General instructions  Keep track of your menstrual periods, including: ? When they occur. ? How heavy they are and how long they last. ? How much time passes between periods.  Keep track of your symptoms, noting when they start, how often you have them, and how long they last.  Take over-the-counter and prescription medicines only as told by your health care provider.  Take vitamin supplements only as told by your health care provider. These may include calcium, vitamin E, and vitamin D.  Use vaginal lubricants or moisturizers to help with vaginal dryness and improve comfort during sex.  Talk with your health care provider before starting any herbal supplements.    Keep all follow-up visits as told by your health care provider. This is important. This includes any group therapy or counseling. Contact a health care provider if:  You have heavy vaginal bleeding or pass blood clots.  Your period lasts more than 2 days longer than normal.  Your periods are recurring sooner than 21 days.  You bleed after having sex. Get help right away if:  You have chest pain, trouble breathing, or trouble talking.  You have severe depression.  You have pain when you urinate.  You have severe headaches.  You have vision problems. Summary  Perimenopause is the time when a woman's body begins to move into menopause. This may happen naturally or as a result of other health problems or medical treatments.  Perimenopause can begin 2-8 years before  menopause, and it usually lasts for 1 year after menopause.  Perimenopausal symptoms can be managed through medicines, lifestyle changes, and complementary therapies such as acupuncture. This information is not intended to replace advice given to you by your health care provider. Make sure you discuss any questions you have with your health care provider. Document Revised: 07/05/2017 Document Reviewed: 08/28/2016 Elsevier Patient Education  2020 Elsevier Inc.  

## 2019-09-07 NOTE — Progress Notes (Signed)
54 y.o. [id number] Married  Caucasian Fe here for annual exam. Periods were irregular in past year with some hot flashes,with  no periods for 2 months and past two periods have been normal with duration and amount. Using Diva cup for menses and happy with choice . Has gained 7 pounds over past year and now walking and stretching. Saw PCP for aex and labs done, normal per patient. No other health issues today. Daughters are growing up ! No other health issues today.  Patient's last menstrual period was 09/02/2019.          Sexually active: Yes.    The current method of family planning is condoms all the time.    Exercising: No.  exercise Smoker:  no  Review of Systems  Constitutional: Negative.   HENT: Negative.   Eyes: Negative.   Respiratory: Negative.   Cardiovascular: Negative.   Gastrointestinal: Negative.   Genitourinary: Negative.   Musculoskeletal: Negative.   Skin: Negative.   Neurological: Negative.   Endo/Heme/Allergies: Negative.   Psychiatric/Behavioral: Negative.     Health Maintenance: Pap:  06-24-12 neg HPV HR neg, 04-17-18 neg HPV HR neg History of Abnormal Pap: no MMG:  Last week neg per patient Self Breast exams: no Colonoscopy: 2003 BMD:  none TDaP:  2018 Shingles: not done Pneumonia: not done Hep C and HIV: not done Labs: if needed.   reports that she has never smoked. She has never used smokeless tobacco. She reports that she does not drink alcohol or use drugs.  Past Medical History:  Diagnosis Date  . Cancer ([place])    basal cell, different areas of body  . Fibromyalgia   . Rosacea     Past Surgical History:  Procedure Laterality Date  . BASAL CELL CARCINOMA EXCISION  2009   times 5  . BREAST SURGERY  1999 or 2000   mastitis  . MOHS SURGERY     for basal cell (face)    Current Outpatient Medications  Medication Sig Dispense Refill  . Ascorbic Acid (VITAMIN C PO) Take 1 tablet by mouth daily.     . B Complex Vitamins (B COMPLEX PO) Take 1  tablet by mouth daily.     Marland Kitchen CALCIUM PO Take 1 tablet by mouth 2 (two) times daily.     . cholecalciferol (VITAMIN D) 1000 units tablet Take 1,000 Units by mouth daily.    Marland Kitchen ibuprofen (ADVIL,MOTRIN) 200 MG tablet Take 400 mg by mouth every 6 (six) hours as needed for headache.    . Multiple Vitamins-Minerals (MULTIVITAMIN PO) Take 1 tablet by mouth daily.     Marland Kitchen omeprazole (PRILOSEC) 40 MG capsule Take 1 capsule (40 mg total) by mouth daily. Take in the morning and at dinnertime. (Patient taking differently: Take 40 mg by mouth as needed. Take in the morning and at dinnertime.) 30 capsule 11  . TURMERIC PO Take 2 capsules by mouth.     Marland Kitchen UNABLE TO FIND Cream for rosacea     No current facility-administered medications for this visit.    Family History  Problem Relation Age of Onset  . Hypertension Mother   . Hypertension Father   . Stroke Father   . Hypertension Brother     ROS:  Pertinent items are noted in HPI.  Otherwise, a comprehensive ROS was negative.  Exam:   BP 120/80   Pulse 76   Temp (!) 97.1 F (36.2 C) (Skin)   Resp 16   Ht 5' 2.5" (1.588  m)   Wt 129 lb (58.5 kg)   LMP 09/02/2019   BMI 23.22 kg/m  Height: 5' 2.5" (158.8 cm) Ht Readings from Last 3 Encounters:  09/07/19 5' 2.5" (1.588 m)  04/17/18 5\' 3"  (1.6 m)  12/25/17 5\' 3"  (1.6 m)    General appearance: alert, cooperative and appears stated age Head: Normocephalic, without obvious abnormality, atraumatic Neck: no adenopathy, supple, symmetrical, trachea midline and thyroid normal to inspection and palpation Lungs: clear to auscultation bilaterally Breasts: normal appearance, no masses or tenderness, No nipple retraction or dimpling, No nipple discharge or bleeding, No axillary or supraclavicular adenopathy Heart: regular rate and rhythm Abdomen: soft, non-tender; no masses,  no organomegaly Extremities: extremities normal, atraumatic, no cyanosis or edema Skin: Skin color, texture, turgor normal. No  rashes or lesions Lymph nodes: Cervical, supraclavicular, and axillary nodes normal. No abnormal inguinal nodes palpated Neurologic: Grossly normal   Pelvic: External genitalia:  no lesions              Urethra:  normal appearing urethra with no masses, tenderness or lesions              Bartholin's and Skene's: normal                 Vagina: normal appearing vagina with normal color and discharge, no lesions              Cervix: multiparous appearance, no cervical motion tenderness and no lesions              Pap taken: No. Bimanual Exam:  Uterus:  normal size, contour, position, consistency, mobility, non-tender and anteverted              Adnexa: normal adnexa and no mass, fullness, tenderness               Rectovaginal: Confirms               Anus:  normal sphincter tone, no lesions  Chaperone present: yes A:  Well Woman with normal exam  Perimenopausal with changes in period with amenorrhea episodes  Mammogram due     P:   Reviewed health and wellness pertinent to exam  Discussed perimenopausal changes and expectations with menses. Needs to advise if no period in 3 months. Given printed information to review. Discussed lab for [place], TSH, Prolactin. Agreeable. Discussed thyroid and pituitary changes can also cause amenorrhea. Questions addressed.  Colonoscopy due, patient aware and will work with PCP.  Mammogram due will schedule  Pap smear: no   counseled on breast self exam, mammography screening, feminine hygiene, menopause, adequate intake of calcium and vitamin D, diet and exercise  return annually or prn  An After Visit Summary was printed and given to the patient.

## 2019-09-08 LAB — PROLACTIN: Prolactin: 15.2 ng/mL (ref 4.8–23.3)

## 2019-09-08 LAB — TSH: TSH: 3.03 u[IU]/mL (ref 0.450–4.500)

## 2019-09-08 LAB — FOLLICLE STIMULATING HORMONE: FSH: 54.5 m[IU]/mL

## 2019-09-25 ENCOUNTER — Telehealth: Payer: Self-pay | Admitting: Certified Nurse Midwife

## 2019-09-25 NOTE — Telephone Encounter (Signed)
Patient received letter about results. She has not been able to call because of work schedule and ask if a detailed message can be left on voicemail.

## 2019-09-25 NOTE — Telephone Encounter (Signed)
Stephanie Foley, CNM  09/08/2019 9:28 AM EST    Notify patient her TSH is normal, Prolactin is normal. FSH is 54.5 and shows menopausal range. She may or may not have another period. If she goes 3 months with no period needs to advise. We discussed why this is important. The hot flashes and night sweats are normal with this change.   Call to patient, left detailed message, ok per dpr. Advised of results from 09/07/19 AEX as seen above per Stephanie Foley, CNM. Advised patient to return call to office if any additional questions.   Routing to provider for final review. Patient is agreeable to disposition. Will close encounter.   Cc: Royal Hawthorn, CMA

## 2019-10-26 ENCOUNTER — Encounter: Payer: Self-pay | Admitting: Certified Nurse Midwife

## 2019-12-14 ENCOUNTER — Telehealth: Payer: Self-pay | Admitting: Obstetrics and Gynecology

## 2019-12-14 NOTE — Telephone Encounter (Signed)
AEX 09/07/19 with DL LMP 12/02/19 FSH 54.5, Prolactin 15.2 and TSH 3.030 from 2/21 AEX   Spoke with pt. Patient states having hot flashes, body aches, sleep disturbances, fatigue and very emotional since finishing last cycle on 12/07/19 (1 week ago) Pt states had some vasomotor sx in the past, but not this bad. Pt is not on HRT currently. Pt states had irregular cycles in past.  Pt states cycle was heavy for 3 out of 5 days. Changed diva cup 4-5 times a day with possible clots when heaviest. Denies any vaginal bleeding today. Pt states with all sx, had PCR Covid Test and it was negative.  Pt advised to be seen for further evaluation. Pt agreeable. Pt requesting appt 5/11 with any provider.Pt scheduled with Dr Sabra Heck at 10 am on 12/15/19. Pt verbalized understanding. CPS neg   Routing to Dr Sabra Heck for review.  Encounter closed.
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
## 2019-12-14 NOTE — Telephone Encounter (Signed)
Patient is in perimenopause and experiencing fatique, her body hurt,very emotional and having hot flashes.

## 2019-12-15 ENCOUNTER — Encounter: Payer: Self-pay | Admitting: Obstetrics & Gynecology

## 2019-12-15 ENCOUNTER — Ambulatory Visit: Payer: 59 | Admitting: Obstetrics & Gynecology

## 2019-12-15 ENCOUNTER — Other Ambulatory Visit: Payer: Self-pay

## 2019-12-15 VITALS — BP 104/68 | HR 64 | Temp 97.0°F | Resp 16 | Wt 130.0 lb

## 2019-12-15 DIAGNOSIS — N926 Irregular menstruation, unspecified: Secondary | ICD-10-CM

## 2019-12-15 DIAGNOSIS — R5383 Other fatigue: Secondary | ICD-10-CM | POA: Diagnosis not present

## 2019-12-16 ENCOUNTER — Encounter: Payer: Self-pay | Admitting: Obstetrics & Gynecology

## 2019-12-16 LAB — SEDIMENTATION RATE: Sed Rate: 8 mm/hr (ref 0–40)

## 2019-12-16 LAB — CBC
Hematocrit: 40.2 % (ref 34.0–46.6)
Hemoglobin: 13.5 g/dL (ref 11.1–15.9)
MCH: 30.3 pg (ref 26.6–33.0)
MCHC: 33.6 g/dL (ref 31.5–35.7)
MCV: 90 fL (ref 79–97)
Platelets: 232 10*3/uL (ref 150–450)
RBC: 4.45 x10E6/uL (ref 3.77–5.28)
RDW: 12.5 % (ref 11.7–15.4)
WBC: 4.2 10*3/uL (ref 3.4–10.8)

## 2019-12-16 LAB — VITAMIN B12: Vitamin B-12: 596 pg/mL (ref 232–1245)

## 2019-12-16 LAB — IRON,TIBC AND FERRITIN PANEL
Ferritin: 33 ng/mL (ref 15–150)
Iron Saturation: 34 % (ref 15–55)
Iron: 131 ug/dL (ref 27–159)
Total Iron Binding Capacity: 390 ug/dL (ref 250–450)
UIBC: 259 ug/dL (ref 131–425)

## 2019-12-16 LAB — ESTRADIOL: Estradiol: <5 pg/mL

## 2019-12-16 LAB — ANA: Anti Nuclear Antibody (ANA): NEGATIVE

## 2019-12-16 LAB — VITAMIN D 25 HYDROXY (VIT D DEFICIENCY, FRACTURES): Vit D, 25-Hydroxy: 29.1 ng/mL — ABNORMAL LOW (ref 30.0–100.0)

## 2019-12-16 LAB — FOLLICLE STIMULATING HORMONE: FSH: 73.3 m[IU]/mL

## 2019-12-17 ENCOUNTER — Telehealth: Payer: Self-pay

## 2019-12-17 NOTE — Telephone Encounter (Signed)
-----   Message from Megan Salon, MD sent at 12/16/2019 10:32 PM EDT ----- Please let pt know her CBC, iron studies, sed rate, ANA, B12 were all normal/negative.  Her Vit D was 29.1.  goal is to be >30.  She does not really need the prescription Vit D dosage.  Should take 2000 IU OTC vit D.  FSH is 73 and estradiol is <5.  This is clearly menopausal range.  Does she want to start HRT?  We briefly discussed this. May need visit to discuss if has lots of questions.  Web based visit is ok as well if needed.  Would recommend starting estradiol 1.0mg  and Prometrium 200mg  nightly days 1-15 each month.  Would recheck 2 months.  She may have bleeding with this but should start when she has stopped the progesterone and should not be irregular.  Ok to send in RX if pt desires.  If she has anything like a normal cycle, she will need to call for PUS.  Last MMG 09/2019.  No family hx of breast cancer.  No DVT hx.

## 2019-12-17 NOTE — Telephone Encounter (Signed)
Left message to call Dash Cardarelli, RN at [facility] [phone].   

## 2019-12-17 NOTE — Telephone Encounter (Signed)
Stephanie Foley, Stephanie Foley Clinical Pool  Phone Number: [phone]  Hi Dr. Sabra Heck-  What are your thoughts since my labwork results ? It seems that my Vit D is low and the probable cause for fatigue/aches? And is my vitamin B on the low side as well?  Please advise on the hormone side as well.  Thank you,  Stephanie Foley

## 2019-12-17 NOTE — Telephone Encounter (Signed)
Spoke with patient, advised as seen below per Dr. Sabra Heck. Patient request that the recommendations for HRT be sent via a MyChart message, she would like to further discuss with Dr. Sabra Heck before starting HRT. Patient declines to schedule right now, will return call to office to schedule. Patient verbalizes understanding and is agreeable.   No RX sent.   MyChart message to patient.   Routing to provider for final review. Patient is agreeable to disposition. Will close encounter.

## 2019-12-21 ENCOUNTER — Telehealth: Payer: Self-pay

## 2019-12-21 ENCOUNTER — Other Ambulatory Visit: Payer: Self-pay

## 2019-12-21 DIAGNOSIS — R102 Pelvic and perineal pain: Secondary | ICD-10-CM

## 2019-12-21 NOTE — Telephone Encounter (Signed)
Patient is calling in regards to pain in lower abdomen. Patient states it is not time for her cycle and is hormonally in premenopause. Patient would like to discuss with nurse. Patient states she is at work and can be reached between 3:15-3:45pm and can leave a detailed message if not reached.

## 2019-12-21 NOTE — Progress Notes (Signed)
PUS orders placed for pt to have PUS on 12/31/19 at 1pm.  Routing to [facility] for precert.  Encounter closed.

## 2019-12-21 NOTE — Telephone Encounter (Signed)
Left detailed message per DPR and pt's request. Pt to keep PUS appt and to call if has any questions or concerns.   Routing to Dr Sabra Heck.  Encounter closed.

## 2019-12-21 NOTE — Telephone Encounter (Signed)
AEX 09/07/19 with DL Last OV 12/15/19 with SM Menopause, no HRT  FSH 73.3 and Estradiol <5.0 on 12/15/19  Spoke with pt. Pt reports having lower abd/pelvic pain that started 2-3  days ago. Pt denies vaginal bleeding, odor, discharge, UTI sx, fever, chills. Pt states in middle of month if was to start cycle. Pt was seen by Dr Sabra Heck on 5/11 and is still unsure of starting HRT. Pt has h/o of irregular cycles, had LMP 12/02/19. Pt states had change in BM with constipation recently that she thinks is due to change in multivitamin that had extra iron. Since changing to only multivitamin, now having regular BMs.  Pt takes Vit D as well for deficiency. Pt states taking Advil for lower abd/pelvic pain that does resolve sx. Advised pt to schedule PUS per Dr Sabra Heck. Pt agreeable. Pt scheduled for PUS on 12/31/19 at 1 pm. Pt verbalized understanding. Pt advised will review with Dr Sabra Heck and return call with any additional recommendations. Pt agreeable.   Routing to Dr Sabra Heck.    Lab results 12/15/19:  [place] is 73 and estradiol is <5.  This is clearly menopausal range.  Does she want to start HRT?  We briefly discussed this. May need visit to discuss if has lots of questions.  Web based visit is ok as well if needed.  Would recommend starting estradiol 1.0mg  and Prometrium 200mg  nightly days 1-15 each month.  Would recheck 2 months.  She may have bleeding with this but should start when she has stopped the progesterone and should not be irregular.  Ok to send in RX if pt desires.  If she has anything like a normal cycle, she will need to call for PUS.  Last MMG 09/2019.  No family hx of breast cancer.  No DVT hx

## 2019-12-22 ENCOUNTER — Telehealth: Payer: Self-pay

## 2019-12-22 NOTE — Telephone Encounter (Signed)
Patient is calling back to state she would like to be seen this Thursday. Patient stated she is in a lot of pain and feels that she cannot wait for the appointment on (12/31/19).

## 2019-12-22 NOTE — Telephone Encounter (Signed)
Patient is calling in regards to [place] appointment on (12/31/19) with Dr. Sabra Heck. Patient states she is in pain and wants a sooner [place] appointment. Patient stated if she can get a sooner appointment with another provider she would want it.

## 2019-12-22 NOTE — Telephone Encounter (Signed)
Spoke with pt. Pt states pelvic pain becoming more consistent "around the clock" that rates at a 7 on pain scale.  Pt states taking Advil and it takes pain to a dull ache around a 2-3. Denies vaginal bleeding, fever, chills, N/V/D. Pt states has burning sensation in abd before any food or water this morning. Pt requesting to move PUS to earlier date. Pt rescheduled for PUS on 12/29/19 at 1:30 pm. Pt agreeable and verbalized understanding. Pt offered to have PUS at outside facility. Pt declined. Pt aware of call for benefits for PUS. Pt given ER precautions with severe abd pain or any fever, chills, N/V/D.   See previous phone encounter dated 12/21/19 for details.  Routing to Dr Sabra Heck for update.  Cc: Hayley for precert.  Encounter closed.

## 2019-12-28 ENCOUNTER — Telehealth: Payer: Self-pay

## 2019-12-28 NOTE — Telephone Encounter (Signed)
Opened in error

## 2019-12-29 ENCOUNTER — Other Ambulatory Visit: Payer: Self-pay

## 2019-12-29 ENCOUNTER — Other Ambulatory Visit: Payer: Self-pay | Admitting: Obstetrics & Gynecology

## 2019-12-29 ENCOUNTER — Encounter: Payer: Self-pay | Admitting: Obstetrics & Gynecology

## 2019-12-29 ENCOUNTER — Ambulatory Visit (INDEPENDENT_AMBULATORY_CARE_PROVIDER_SITE_OTHER): Payer: 59

## 2019-12-29 ENCOUNTER — Ambulatory Visit: Payer: 59 | Admitting: Obstetrics & Gynecology

## 2019-12-29 VITALS — BP 114/70 | HR 68 | Temp 97.2°F | Resp 16 | Wt 129.0 lb

## 2019-12-29 DIAGNOSIS — R102 Pelvic and perineal pain: Secondary | ICD-10-CM

## 2019-12-29 DIAGNOSIS — D251 Intramural leiomyoma of uterus: Secondary | ICD-10-CM | POA: Diagnosis not present

## 2019-12-29 DIAGNOSIS — N959 Unspecified menopausal and perimenopausal disorder: Secondary | ICD-10-CM | POA: Diagnosis not present

## 2019-12-29 NOTE — Progress Notes (Signed)
54 y.o. [id number] Married White or Caucasian female here for pelvic ultrasound due to pelvic pain.  Pt reports pain has resolved over the past three days.    Pt wonders if the pain she was experiencing was due to new vitamins she took.  She noticed her stools were different.  She has decided to not take anymore of the multivitamin she recently started.    Reviewed lab work with pt.  She has started taking Vit D.  Feels this is helping her fatigue.    Patient's last menstrual period was 12/02/2019 (exact date).  Contraception: perimenopausal  Findings:  UTERUS: 7.5 x 5.6 x 4.5cm with 2.0cm fibroid EMS:2.42mm ADNEXA: Left ovary: 1.0 x 0.8 x 0.9cm       Right ovary:  2.2 x 0.9 x 1.2cm CUL DE SAC: no free fluid  Discussion:  Ultrasonographer supervised.  Images reviewed.  Findings discussed.  No source of pain identified with ultrasound today.  Fibroid finding discussed as well.  No treatment recommended.  Lab work from 12/15/2019 discussed.  She is aware that her Tamora is in menopausal range and that she also had one about three months ago that was 54.  Advised pt she should not have bleeding with these values and requested she call back with any changes/concerns.    Assessment:  Pelvic pain that has resolved Fatigue Mildly decreased Vit D FSH in menopausal range  Plan:  Pt knows to call with any vaginal bleeding or return of pelvic/abdominal pain   About 20 minutes spent in face to face discussion with pt.

## 2019-12-31 ENCOUNTER — Other Ambulatory Visit: Payer: Self-pay | Admitting: Obstetrics & Gynecology

## 2019-12-31 ENCOUNTER — Other Ambulatory Visit: Payer: Self-pay
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
## 2020-01-01 ENCOUNTER — Other Ambulatory Visit: Payer: Self-pay

## 2020-01-01 ENCOUNTER — Ambulatory Visit (INDEPENDENT_AMBULATORY_CARE_PROVIDER_SITE_OTHER): Payer: 59 | Admitting: Neurology

## 2020-01-01 ENCOUNTER — Encounter: Payer: Self-pay | Admitting: Neurology

## 2020-01-01 VITALS — BP 112/58 | HR 69 | Resp 18 | Ht 63.0 in | Wt 128.0 lb

## 2020-01-01 DIAGNOSIS — G43009 Migraine without aura, not intractable, without status migrainosus: Secondary | ICD-10-CM

## 2020-01-01 MED ORDER — SUMATRIPTAN SUCCINATE 100 MG PO TABS: ORAL_TABLET | ORAL | 5 refills | Status: DC

## 2020-01-01 NOTE — Patient Instructions (Signed)
1.  Take sumatriptan as directed.  May use Advil (with or without caffeine).  Take at earliest onset of headache. 2.  Limit use of pain relievers to no more than 2 days out of week to prevent risk of rebound or medication-overuse headache. 3.  Keep headache diary 4.  Follow up in 6 months.

## 2020-01-05 ENCOUNTER — Other Ambulatory Visit: Payer: Self-pay

## 2020-01-05 ENCOUNTER — Other Ambulatory Visit: Payer: Self-pay | Admitting: Obstetrics & Gynecology

## 2020-05-09 ENCOUNTER — Ambulatory Visit (INDEPENDENT_AMBULATORY_CARE_PROVIDER_SITE_OTHER): Payer: 59 | Admitting: Obstetrics & Gynecology

## 2020-05-09 ENCOUNTER — Telehealth: Payer: Self-pay

## 2020-05-09 ENCOUNTER — Other Ambulatory Visit: Payer: Self-pay

## 2020-05-09 ENCOUNTER — Encounter: Payer: Self-pay | Admitting: Obstetrics & Gynecology

## 2020-05-09 VITALS — BP 102/60 | HR 64 | Resp 16 | Wt 127.0 lb

## 2020-05-09 DIAGNOSIS — R1906 Epigastric swelling, mass or lump: Secondary | ICD-10-CM

## 2020-05-09 DIAGNOSIS — R1010 Upper abdominal pain, unspecified: Secondary | ICD-10-CM

## 2020-05-09 DIAGNOSIS — R3989 Other symptoms and signs involving the genitourinary system: Secondary | ICD-10-CM | POA: Diagnosis not present

## 2020-05-09 DIAGNOSIS — R102 Pelvic and perineal pain: Secondary | ICD-10-CM | POA: Diagnosis not present

## 2020-05-09 LAB — POCT URINALYSIS DIPSTICK
Bilirubin, UA: NEGATIVE
Glucose, UA: NEGATIVE
Ketones, UA: NEGATIVE
Nitrite, UA: NEGATIVE
Protein, UA: NEGATIVE
Urobilinogen, UA: NEGATIVE E.U./dL — AB
pH, UA: 5 (ref 5.0–8.0)

## 2020-05-09 MED ORDER — SULFAMETHOXAZOLE-TRIMETHOPRIM 800-160 MG PO TABS: 1.0000 | ORAL_TABLET | Freq: Two times a day (BID) | ORAL | 0 refills | Status: DC

## 2020-05-09 NOTE — Telephone Encounter (Signed)
Orders placed per Dr Sabra Heck. See OV notes from 05/09/20. Cc: Hayley for precert  Encounter closed.

## 2020-05-09 NOTE — Progress Notes (Unsigned)
Need Abd/ pelvis CT scan orders per Dr Sabra Heck. See OV note on 05/09/20.  Pt states needing benefits for [employer] with [facility] and [place] [facility].   Cc: Hayley for precert

## 2020-05-09 NOTE — Telephone Encounter (Signed)
Patient is calling in regards to lower abdomen pain and cramping.

## 2020-05-09 NOTE — Telephone Encounter (Signed)
Spoke with patient. Patient is requesting OV ASAP. Reports ongoing pelvic pain that has increased over the past 2 wks. Describes pain as low in pelvis and pressure. Patient has increased and become more constant over the past weekend. Pain currently 7/10, just took 2 Advil 15 minutes ago. Has been taking Advil with no relief in symptoms. Denies urinary symptoms, fever/chills, N/V, vag bleeding, vag d/c or odor. Reports no appetite.   Last PUS 12/29/19 -single anterior fibroid   OV scheduled for today at 11:30am with Dr. Sabra Heck.  Patient is agreeable to date and time.  Encounter closed.
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
## 2020-05-10 ENCOUNTER — Telehealth: Payer: Self-pay

## 2020-05-10 LAB — CBC WITH DIFFERENTIAL/PLATELET
Basophils Absolute: 0 10*3/uL (ref 0.0–0.2)
Basos: 1 %
EOS (ABSOLUTE): 0.2 10*3/uL (ref 0.0–0.4)
Eos: 5 %
Hematocrit: 42.5 % (ref 34.0–46.6)
Hemoglobin: 13.8 g/dL (ref 11.1–15.9)
Immature Grans (Abs): 0 10*3/uL (ref 0.0–0.1)
Immature Granulocytes: 0 %
Lymphocytes Absolute: 1.6 10*3/uL (ref 0.7–3.1)
Lymphs: 34 %
MCH: 29.9 pg (ref 26.6–33.0)
MCHC: 32.5 g/dL (ref 31.5–35.7)
MCV: 92 fL (ref 79–97)
Monocytes Absolute: 0.4 10*3/uL (ref 0.1–0.9)
Monocytes: 9 %
Neutrophils Absolute: 2.5 10*3/uL (ref 1.4–7.0)
Neutrophils: 51 %
Platelets: 237 10*3/uL (ref 150–450)
RBC: 4.62 x10E6/uL (ref 3.77–5.28)
RDW: 13.1 % (ref 11.7–15.4)
WBC: 4.8 10*3/uL (ref 3.4–10.8)

## 2020-05-10 LAB — COMPREHENSIVE METABOLIC PANEL WITH GFR
ALT: 11 IU/L (ref 0–32)
AST: 18 IU/L (ref 0–40)
Albumin/Globulin Ratio: 2.1 (ref 1.2–2.2)
Albumin: 5.1 g/dL — ABNORMAL HIGH (ref 3.8–4.9)
Alkaline Phosphatase: 73 IU/L (ref 44–121)
BUN/Creatinine Ratio: 21 (ref 9–23)
BUN: 14 mg/dL (ref 6–24)
Bilirubin Total: 0.4 mg/dL (ref 0.0–1.2)
CO2: 25 mmol/L (ref 20–29)
Calcium: 9.3 mg/dL (ref 8.7–10.2)
Chloride: 103 mmol/L (ref 96–106)
Creatinine, Ser: 0.67 mg/dL (ref 0.57–1.00)
GFR calc Af Amer: 118 mL/min/1.73 (ref >59)
GFR calc non Af Amer: 102 mL/min/1.73 (ref >59)
Globulin, Total: 2.4 g/dL (ref 1.5–4.5)
Glucose: 91 mg/dL (ref 65–99)
Potassium: 4 mmol/L (ref 3.5–5.2)
Sodium: 141 mmol/L (ref 134–144)
Total Protein: 7.5 g/dL (ref 6.0–8.5)

## 2020-05-10 LAB — URINALYSIS, MICROSCOPIC ONLY
Bacteria, UA: NONE SEEN
Casts: NONE SEEN /lpf
Epithelial Cells (non renal): NONE SEEN /hpf (ref 0–10)

## 2020-05-10 NOTE — Telephone Encounter (Signed)
Spoke with [facility]. Pt scheduled for 10/6 at 130 pm for CT Abd/pelvis W contrast. Pt to drink oral contrast 2 hours prior to CT.   Call placed back to husband, Shanon Brow. Gave CT scan appt at [place]. Pt and husband agreeable and verbalized understanding to date and time of appt. Thankful for earlier appt and help with scheduling. mychart message sent to pt with details of appt per pt's request.   Orders faxed to [place].  Routing to Dr Sabra Heck for review Encounter closed.

## 2020-05-10 NOTE — Telephone Encounter (Signed)
Spoke with pt's husband Shanon Brow, ok per DPR. Advised to call back to [facility] and see what places are in network for CT Scan of abd/pelivs. Shanon Brow agreeable and thankful for advice. Will return call once has list of places to go.

## 2020-05-10 NOTE — Telephone Encounter (Signed)
Spoke with pt's husband Shanon Brow, ok per DPR. Gave in-network Imaging places of either [facility] or [facility].  Duffy Rhody will call and see what appts available before 10/15 and return call. Agreeable.

## 2020-05-10 NOTE — Telephone Encounter (Signed)
Patient's spouse Shanon Brow (ok per dpr) calling to speak with nurse to move her CT appointment up from the 15th to this week. [place] are in the [employer]. His number is [phone].

## 2020-05-11 ENCOUNTER — Telehealth: Payer: Self-pay

## 2020-05-11 ENCOUNTER — Other Ambulatory Visit: Payer: Self-pay | Admitting: Obstetrics & Gynecology

## 2020-05-11 LAB — URINE CULTURE

## 2020-05-11 MED ORDER — IBUPROFEN 800 MG PO TABS: 800.0000 mg | ORAL_TABLET | Freq: Three times a day (TID) | ORAL | 0 refills | Status: DC | PRN

## 2020-05-11 MED ORDER — TAMSULOSIN HCL 0.4 MG PO CAPS: 0.4000 mg | ORAL_CAPSULE | Freq: Every day | ORAL | 0 refills | Status: DC

## 2020-05-11 NOTE — Telephone Encounter (Signed)
Spoke with pt. Pt given results and recommendations per Dr Sabra Heck for CT scan. Pt agreeable and verbalized understanding.  Pt also given results of UC. Pt verbalized understanding.  Pt and husband aware of Rxs sent to pharmacy.    Call placed to [facility] to make f/u appt. Pt scheduled for 10/7 at 10 am with Dr Junious Silk.  Pt agreeable and verbalized understanding of date and time of appt at [facility].  Pt and husband expressed thanks and are grateful for such quick results and treatment.  Routing to Dr Sabra Heck for review and update  Encounter closed.  Out of IMG hold

## 2020-05-11 NOTE — Telephone Encounter (Signed)
Husband Stephanie Foley has returned call to office again and stating report is back. We have faxed report from [place] at office.   Routing to Dr Sabra Heck for review. Please advise

## 2020-05-11 NOTE — Telephone Encounter (Signed)
Please let pt know she has a right 37mm renal stone and some dilation of the ureter and kidney that are resulting from this stone.  It's down low near the bladder.  She needs to push water, start flomax 0.4mg  daily, and take 800mg  ibuprofen every 8 hours as needed for pain.  I do want her to see urology as soon as possible.  Please have her strain her urine.    She has a ventral hernia with some fat in it.  There is no bowel in the hernia.  This is what I was feeling in the upper abdomen.  She may want to see a general surgeon at some point but this is not urgent.    She has 1cm liver cyst and a small left renal cyst.  These are incidental finding and don't need any follow up.  Lastly, the fibroid that was seen on ultrasound in May was also seen on the CT scan.      Out of imaging hold.

## 2020-05-11 NOTE — Telephone Encounter (Signed)
Patient's spouse David(ok per dpr) calling to speak with nurse. Patient is still in a lot of pain and would like the results of the CT scan read today if possible. His number is [phone].

## 2020-05-11 NOTE — Telephone Encounter (Signed)
Left message on husband Davids number per DPR for return call.

## 2020-05-11 NOTE — Telephone Encounter (Signed)
Husband Shanon Brow returned call. Spoke with Shanon Brow and Stephanie Foley. Stephanie Foley states just finished having CT scan and states " has kidney stone" but would like Dr Sabra Heck to read results and advise.  Stephanie Foley advised that Dr Sabra Heck not in office today and report from CT not resulted yet. Will review with Dr Sabra Heck or another provider in office when results are back. Stephanie Foley agreeable and verbalized understanding.   Routing to Dr Sabra Heck.

## 2020-05-12 ENCOUNTER — Encounter (HOSPITAL_BASED_OUTPATIENT_CLINIC_OR_DEPARTMENT_OTHER)
Admission: RE | Disposition: A | Discharge status: Home / Self Care | Payer: Self-pay | Source: Ambulatory Visit | Attending: Urology

## 2020-05-12 ENCOUNTER — Ambulatory Visit (HOSPITAL_BASED_OUTPATIENT_CLINIC_OR_DEPARTMENT_OTHER)
Admission: RE | Admit: 2020-05-12 | Discharge: 2020-05-12 | Disposition: A | Discharge status: Home / Self Care | Payer: 59 | Source: Ambulatory Visit | Attending: Urology | Admitting: Urology

## 2020-05-12 ENCOUNTER — Ambulatory Visit (HOSPITAL_COMMUNITY): Payer: 59

## 2020-05-12 ENCOUNTER — Other Ambulatory Visit: Payer: Self-pay | Admitting: Urology

## 2020-05-12 ENCOUNTER — Encounter (HOSPITAL_BASED_OUTPATIENT_CLINIC_OR_DEPARTMENT_OTHER): Payer: Self-pay | Admitting: Urology

## 2020-05-12 DIAGNOSIS — Z8616 Personal history of COVID-19: Secondary | ICD-10-CM | POA: Insufficient documentation

## 2020-05-12 DIAGNOSIS — N201 Calculus of ureter: Secondary | ICD-10-CM | POA: Insufficient documentation

## 2020-05-12 HISTORY — PX: EXTRACORPOREAL SHOCK WAVE LITHOTRIPSY: SHX1557

## 2020-05-12 LAB — POCT PREGNANCY, URINE: Preg Test, Ur: NEGATIVE

## 2020-05-12 SURGERY — LITHOTRIPSY, ESWL
Anesthesia: LOCAL | Laterality: Right

## 2020-05-12 MED ORDER — DIAZEPAM 5 MG PO TABS
10.0000 mg | ORAL_TABLET | ORAL | Status: AC
  Administered 2020-05-12: 10 mg via ORAL

## 2020-05-12 MED ORDER — DIPHENHYDRAMINE HCL 25 MG PO CAPS
ORAL_CAPSULE | ORAL | Status: AC
  Filled 2020-05-12: qty 1

## 2020-05-12 MED ORDER — DIAZEPAM 5 MG PO TABS
ORAL_TABLET | ORAL | Status: AC
  Filled 2020-05-12: qty 2

## 2020-05-12 MED ORDER — SODIUM CHLORIDE 0.9 % IV SOLN: INTRAVENOUS | Status: DC

## 2020-05-12 MED ORDER — HYDROCODONE-ACETAMINOPHEN 5-325 MG PO TABS
1.0000 | ORAL_TABLET | ORAL | 0 refills | Status: DC | PRN
Start: 2020-05-12 — End: 2020-09-12

## 2020-05-12 MED ORDER — CIPROFLOXACIN HCL 500 MG PO TABS
500.0000 mg | ORAL_TABLET | ORAL | Status: AC
  Administered 2020-05-12: 500 mg via ORAL

## 2020-05-12 MED ORDER — CIPROFLOXACIN HCL 500 MG PO TABS
ORAL_TABLET | ORAL | Status: AC
  Filled 2020-05-12: qty 1

## 2020-05-12 MED ORDER — DIPHENHYDRAMINE HCL 25 MG PO CAPS
25.0000 mg | ORAL_CAPSULE | ORAL | Status: AC
  Administered 2020-05-12: 25 mg via ORAL

## 2020-05-12 MED ORDER — ONDANSETRON HCL 4 MG PO TABS: 4.0000 mg | ORAL_TABLET | Freq: Every day | ORAL | 0 refills | Status: DC | PRN

## 2020-05-12 NOTE — Discharge Instructions (Signed)
See [facility] discharge instructions in chart.  

## 2020-05-12 NOTE — H&P (Signed)
See [facility] scanned into chart 

## 2020-05-12 NOTE — Progress Notes (Signed)
Patient walked down to [facility] from [facility]. Add on for [facility] at  1630. Patient reports taking motrin yesterday. Ate oatmeal at 0930 this morning. [facility] notified. Patient tested positive for covid  March 18, 2020. No test needed per protocol. History and medications reviewed. Instructed to remain NPO. Will return to [facility] at 1430. Driver secured.

## 2020-05-12 NOTE — Op Note (Signed)
See [facility] OP note scanned into chart. Also because of the size, density, location and other factors that cannot be anticipated I feel this will likely be a staged procedure. This fact supersedes any indication in the scanned [facility] operative note to the contrary.  

## 2020-05-13 ENCOUNTER — Encounter (HOSPITAL_BASED_OUTPATIENT_CLINIC_OR_DEPARTMENT_OTHER): Payer: Self-pay | Admitting: Urology

## 2020-05-18 ENCOUNTER — Telehealth: Payer: Self-pay

## 2020-05-18 NOTE — Telephone Encounter (Signed)
AEX 09/2019 with DL Recent h/o lithotripsy on 10/7 d/t kidney stone   Spoke with pt. Pt states calling to give update from kidney stone and see about taking HRT.   Pt states having lithotripsy on 10/7 and passed rest of stones on 10/9. Recent kidney stone see on CT on 10/6. Pt denies any flank pain, voiding well. Pt states taking PO toradol per Dr Claudia Desanctis x 5 days daily.  Pt states now having exhaustion and "vibration" all over body x 1 week before lithotripsy and body trying to recover from all that's happened in 1 week. States states having trouble sleeping with possible insomnia and having hot flashes. Pt states did finally sleep last night, but was awaken by hot flashes. Pt states hot flashes, brain fog, occasional vaginal dryness are happening more often now and would like to see about starting HRT. Pt advised to have OV for consult. Pt declines at this time.  Pt advised will review with Dr Sabra Heck about HRT and return call. Pt agreeable.   Routing to Dr Sabra Heck.

## 2020-05-19 NOTE — Telephone Encounter (Signed)
Spoke with pt. Pt given update and recommendations per Dr Sabra Heck. Pt agreeable to start taking Estroven Complete. Pt thankful for advice. Pt states feeling much better today since lithotripsy.  Encounter closed

## 2020-05-19 NOTE — Telephone Encounter (Signed)
Please let pt know her Roscoe was 24 in May.  It sounds like her symptoms have just changed with the stone and therefore should also calm down.  I'm not sure we need to start HRT yet.  I would suggest starting Estroven Complete at night first.

## 2020-05-20 ENCOUNTER — Other Ambulatory Visit: Payer: 59
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
## 2020-05-26 ENCOUNTER — Other Ambulatory Visit: Payer: Self-pay

## 2020-05-26 ENCOUNTER — Ambulatory Visit: Payer: 59 | Admitting: Family Medicine

## 2020-05-26 ENCOUNTER — Encounter: Payer: Self-pay | Admitting: Family Medicine

## 2020-05-26 DIAGNOSIS — M2550 Pain in unspecified joint: Secondary | ICD-10-CM | POA: Diagnosis not present

## 2020-05-26 NOTE — Patient Instructions (Addendum)
CoQ10 200mg  daily Choline 500mg  daily DHEA 50mg  daily for one month Seems to be more of a post viral not related to concussion Happy Anniversary Send me an update in 4 weeks You know where I am if you need me

## 2020-05-26 NOTE — Assessment & Plan Note (Addendum)
Patient has had more of a polyarthralgia.  I think that this is either secondary to a post viral versus the possibility of adrenal fatigue with patient having this as well as recent surgery for kidney stones.  I do not think that this has any association whatsoever of her postconcussive syndrome that I think is completely resolved even before the last time I have seen patient.  Discussed some over-the-counter medications with patient wanting to avoid a lot of prescription medications.  We discussed that some of them post viral illnesses we have seen has taken 3 to 6 months to resolve.  Encourage her to try to be active whenever she can.  If continuing to have trouble I would like patient to consider the possibility of laboratory work-up and patient declined today.  Can follow-up scheduled in 4 weeks otherwise.  If patient has worsening of any of her headaches or dizziness she should follow-up with neurology.  Patient understands this.

## 2020-06-08 ENCOUNTER — Encounter: Payer: Self-pay | Admitting: Family Medicine

## 2020-08-15 ENCOUNTER — Ambulatory Visit: Payer: 59 | Admitting: Neurology

## 2020-09-12 ENCOUNTER — Ambulatory Visit: Payer: BC Managed Care – PPO | Admitting: Obstetrics & Gynecology

## 2020-09-12 ENCOUNTER — Other Ambulatory Visit: Payer: Self-pay

## 2020-09-12 ENCOUNTER — Encounter: Payer: Self-pay | Admitting: Obstetrics & Gynecology

## 2020-09-12 VITALS — BP 118/80

## 2020-09-12 DIAGNOSIS — R319 Hematuria, unspecified: Secondary | ICD-10-CM

## 2020-09-12 DIAGNOSIS — N950 Postmenopausal bleeding: Secondary | ICD-10-CM | POA: Diagnosis not present

## 2020-09-12 NOTE — Progress Notes (Signed)
    Stephanie Foley September 07, 1965 [id number]        55 y.o.  [id number]   RP: Postmenopausal bleeding x 3 days  HPI:  Last menstrual period 12/2019.  Had severe hot flushes and night sweats at that time.  Went through a kidney stone treated with Lithotripsy, then had Covid.  Felt very tired, no energy.  Started on [employer] and Melatonin.  Seen by Integrative medicine.  TSH was borderline, so started on NP Thyroid 30 now on 2 tab daily.  Started bleeding vaginally on Saturday 09/10/2020 3 days ago.  Bleeding was heavy at first, now moderate.  Had breast tenderness bilaterally.  No pelvic pain.  Blood in urine?  No UTI Sx otherwise.  No fever.   OB History  Gravida Para Term Preterm AB Living  6 3 3   3 3   SAB IAB Ectopic Multiple Live Births  3       3    # Outcome Date GA Lbr Len/2nd Weight Sex Delivery Anes PTL Lv  6 SAB           5 SAB           4 SAB           3 Term     F Vag-Spont   LIV  2 Term     F Vag-Spont   LIV  1 Term     M Vag-Spont   LIV    Past medical history,surgical history, problem list, medications, allergies, family history and social history were all reviewed and documented in the EPIC chart.   Directed ROS with pertinent positives and negatives documented in the history of present illness/assessment and plan.  Exam:  Vitals:   09/12/20 1617  BP: 118/80   General appearance:  Normal  Abdomen: Normal  Gynecologic exam: Vulva normal.  Speculum:  Cervix/Vagina normal.  Mild dark blood at Exo-cervix/vagina.  Bimanual exam:  Uterus AV, normal volume, mobile, NT.  No adnexal mass, NT bilaterally.  U/A: Yellow clear, Nit Neg, Pro Neg, WBC 0-5, RBC 3-10, Bacteria Neg.  Urine culture Neg.   Assessment/Plan:  55 y.o. [id number]   1. Postmenopausal bleeding Probably perimenopausal.  Patient was doing well on Estroven.  Will recheck [facility] today.  F/U Pelvic US to r/o Endometrial pathology like a Polyp, SM Fibroid, Endometrial Hyperplasia and Endometrial Ca.  Counseling on  Perimenopausal and Postmenopausal bleeding done.   [facility] - US Transvaginal Non-OB; Future  2. Hematuria, unspecified type U/A wnl, mild RBC 3-10 probably contamination from vaginal bleeding.  Pending Urine Culture. - Urinalysis,Complete w/RFL Culture  Other orders - NP THYROID 30 MG tablet; TAKE 1 TABLET BY MOUTH IN THE MORNING ON AN EMPTY STOMACH; MAY EAT IN 30-60 MINUTES - Urine Culture - REFLEXIVE URINE CULTURE  Princess Bruins MD, 4:56 PM 09/12/2020

## 2020-09-13 LAB — URINALYSIS, COMPLETE W/RFL CULTURE
Bacteria, UA: NONE SEEN /HPF
Bilirubin Urine: NEGATIVE
Glucose, UA: NEGATIVE
Hyaline Cast: NONE SEEN /LPF
Ketones, ur: NEGATIVE
Leukocyte Esterase: NEGATIVE
Nitrites, Initial: NEGATIVE
Protein, ur: NEGATIVE
Specific Gravity, Urine: 1.02 (ref 1.001–1.03)
pH: 5 (ref 5.0–8.0)

## 2020-09-13 LAB — URINE CULTURE
MICRO NUMBER:: 11502710
SPECIMEN QUALITY:: ADEQUATE

## 2020-09-13 LAB — CULTURE INDICATED

## 2021-02-03 IMAGING — DX DG ABDOMEN 1V
1 series · 1 of 1 positions shown · non-contrast
Comparison: 05/12/2020

CLINICAL DATA: Pre lithotripsy

EXAM:
ABDOMEN - 1 VIEW

[abdomen kub]
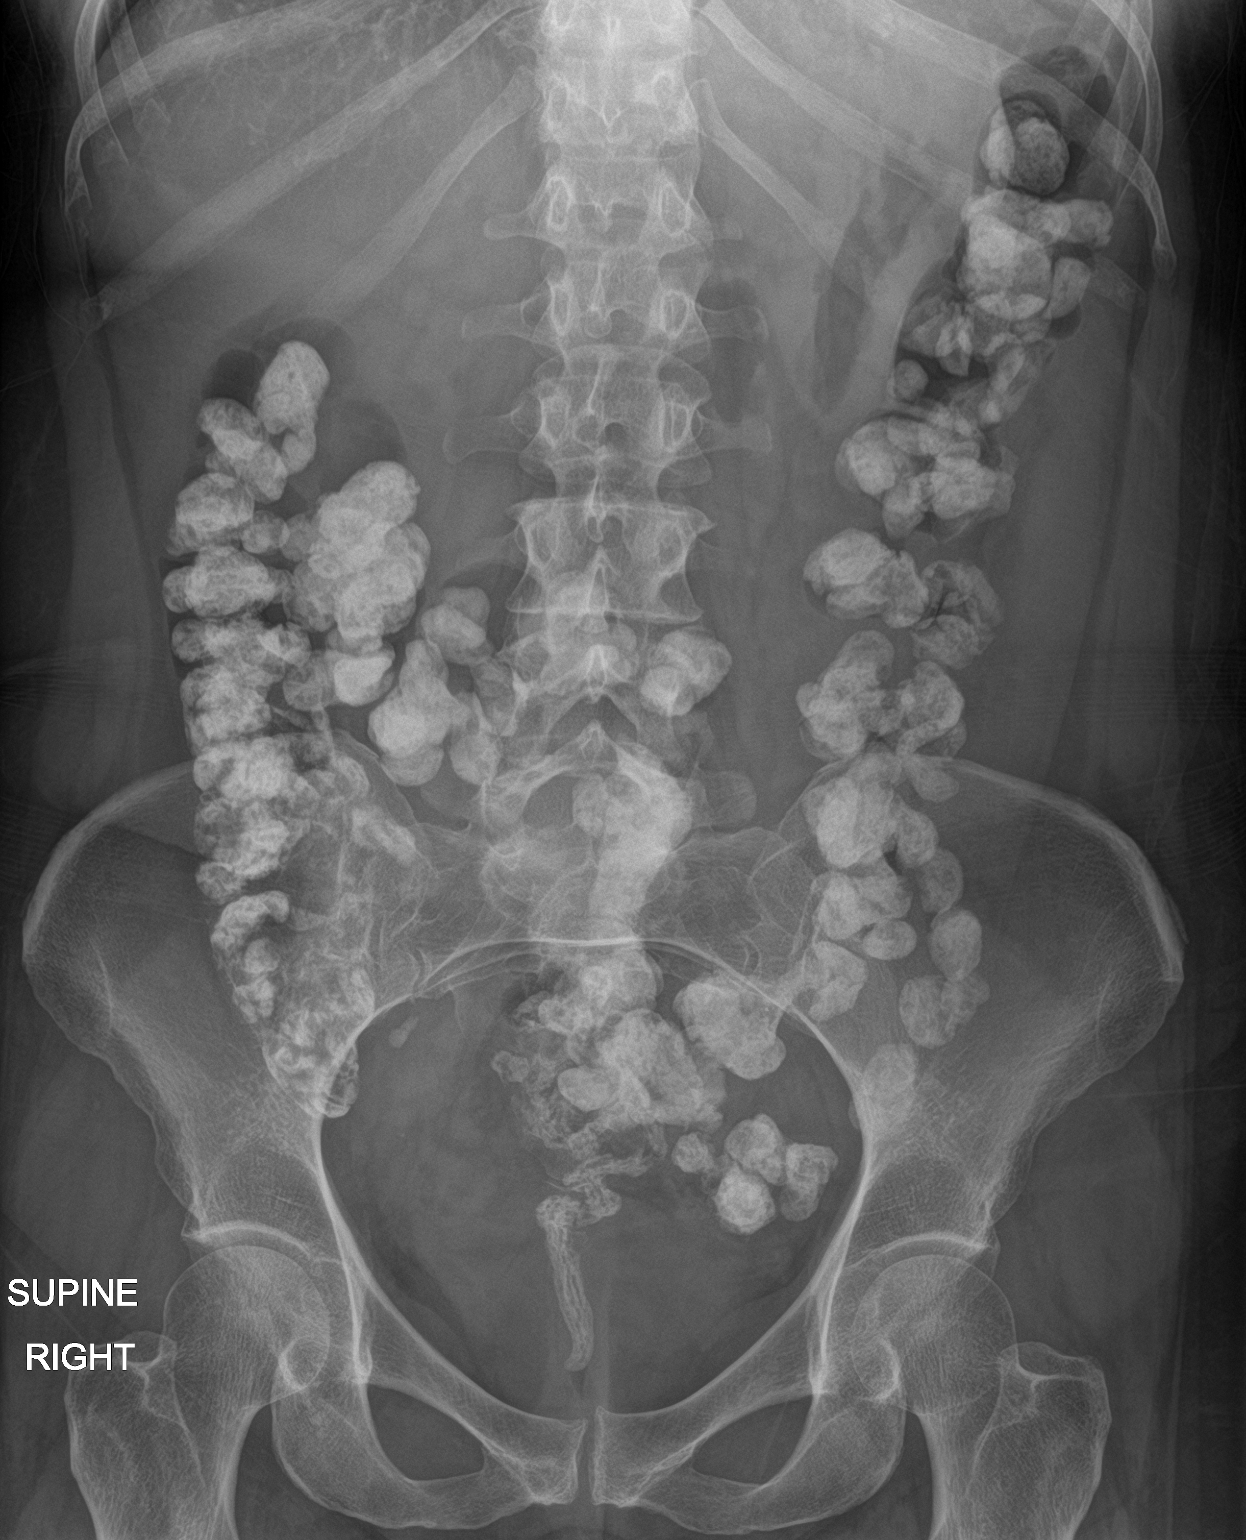

[1 of 1 positions shown; findings below may reference images not displayed]

FINDINGS: Retained barium throughout colon.

Again identified oblong RIGHT pelvic calcification versus 2 adjacent
calcifications, in aggregate 9 x 4 mm, question ureteral
calculus/calculi versus phleboliths

No additional urinary tract calcifications identified though
portions of both kidneys are obscured by contrast in colon.

Osseous structures unremarkable.

Nonobstructive bowel gas pattern.
IMPRESSION: 9 x 4 mm RIGHT ureteral calculus/calculi versus phleboliths,
unchanged

## 2021-10-31 ENCOUNTER — Ambulatory Visit: Payer: BC Managed Care – PPO | Admitting: Radiology

## 2021-10-31 ENCOUNTER — Other Ambulatory Visit: Payer: Self-pay

## 2021-10-31 ENCOUNTER — Encounter: Payer: Self-pay | Admitting: Radiology

## 2021-10-31 ENCOUNTER — Other Ambulatory Visit (HOSPITAL_COMMUNITY)
Admission: RE | Admit: 2021-10-31 | Discharge: 2021-10-31 | Disposition: A | Discharge status: Home / Self Care | Payer: BC Managed Care – PPO | Source: Ambulatory Visit | Attending: Radiology | Admitting: Radiology

## 2021-10-31 ENCOUNTER — Ambulatory Visit (INDEPENDENT_AMBULATORY_CARE_PROVIDER_SITE_OTHER): Payer: BC Managed Care – PPO | Admitting: Radiology

## 2021-10-31 VITALS — BP 102/74 | Ht 62.5 in | Wt 111.0 lb

## 2021-10-31 DIAGNOSIS — Z01419 Encounter for gynecological examination (general) (routine) without abnormal findings: Secondary | ICD-10-CM

## 2021-10-31 DIAGNOSIS — E559 Vitamin D deficiency, unspecified: Secondary | ICD-10-CM

## 2021-10-31 DIAGNOSIS — N952 Postmenopausal atrophic vaginitis: Secondary | ICD-10-CM

## 2021-10-31 DIAGNOSIS — R5383 Other fatigue: Secondary | ICD-10-CM

## 2021-10-31 DIAGNOSIS — N959 Unspecified menopausal and perimenopausal disorder: Secondary | ICD-10-CM

## 2021-10-31 DIAGNOSIS — N644 Mastodynia: Secondary | ICD-10-CM

## 2021-10-31 MED ORDER — ESTRADIOL 10 MCG VA TABS: 1.0000 | ORAL_TABLET | VAGINAL | 4 refills | Status: DC

## 2021-10-31 NOTE — Progress Notes (Signed)
? ?Stephanie Foley 1966-03-30 [id number] ? ? ?History: Postmenopausal G104P3 56 y.o. presents for annual exam.Complains of left breast pain (started over the weekend, has resolved), decreased breast size (has lost weight recently),vaginal dryness, pain with intercourse, increased hot flashes x's couple weeks, severe migraine yesterday-today.  Patient states she had mold in her house and has been very sick. Treated by functional medicine and nutritionist. ?Was using oral estradiol but it caused insomnia and bleeding ? ?Currently taking "standard process herbs" Femco, Phyto-BL, Osteaderm-V cream on vulva,  ? ? ?Gynecologic History ?Postmenopausal ?Last Pap: 2019. Results were: normal ?Last mammogram: 2021. Results were: normal ?Last colonoscopy:never ?HRT use: not currently ? ?Obstetric History ?OB History  ?Gravida Para Term Preterm AB Living  ?'6 3 3   3 3  '$ ?SAB IAB Ectopic Multiple Live Births  ?3       3  ?  ?# Outcome Date GA Lbr Len/2nd Weight Sex Delivery Anes PTL Lv  ?6 SAB           ?5 SAB           ?4 SAB           ?3 Term     F Vag-Spont   LIV  ?2 Term     F Vag-Spont   LIV  ?1 Term     M Vag-Spont   LIV  ? ? ? ?The following portions of the patient's history were reviewed and updated as appropriate: allergies, current medications, past family history, past medical history, past social history, past surgical history, and problem list. ? ?Review of Systems ?Pertinent items noted in HPI and remainder of comprehensive ROS otherwise negative.  ?Past medical history, past surgical history, family history and social history were all reviewed and documented in the EPIC chart. ? ?Exam: ? ?Vitals:  ? 10/31/21 1511  ?BP: 102/74  ?Weight: 111 lb (50.3 kg)  ?Height: 5' 2.5" (1.588 m)  ? ?Body mass index is 19.98 kg/m?. ? ?General appearance:  Normal ?Thyroid:  Symmetrical, normal in size, without palpable masses or nodularity. ?Respiratory ? Auscultation:  Clear without wheezing or rhonchi ?Cardiovascular ? Auscultation:   Regular rate, without rubs, murmurs or gallops ? Edema/varicosities:  Not grossly evident ?Abdominal ? Soft,nontender, without masses, guarding or rebound. ? Liver/spleen:  No organomegaly noted ? Hernia:  None appreciated ? Skin ? Inspection:  Grossly normal ?Breasts: Examined lying and sitting.  ? Right: Without masses, retractions, nipple discharge or axillary adenopathy. ? ? Left: Without masses, retractions, nipple discharge or axillary adenopathy. ?Genitourinary  ? Inguinal/mons:  Normal without inguinal adenopathy ? External genitalia:  Normal appearing vulva with no masses, tenderness, or lesions ? BUS/Urethra/Skene's glands:  Normal ? Vagina:  Normal appearing with normal color and discharge, no lesions. Atrophy: severe  ? Cervix:  Normal appearing without discharge or lesions ? Uterus:  Normal in size, shape and contour.  Midline and mobile, nontender ? Adnexa/parametria:   ?  Rt: Normal in size, without masses or tenderness. ?  Lt: Normal in size, without masses or tenderness. ? Anus and perineum: Normal ?  ? ?Patient informed chaperone available to be present for breast and pelvic exam. Patient has requested no chaperone to be present. Patient has been advised what will be completed during breast and pelvic exam.  ? ?Assessment/Plan:   ?1. Encounter for gynecological examination with Papanicolaou smear of cervix ?Pap with co-testing ?Schedule colonoscopy ?Schedule dexa  ?- Cytology - PAP( [facility]) ? ?2. Menopausal and postmenopausal disorder ?  Schedule appt in 2 weeks to discuss lab results/management  ?- Estradiol ?- FSH ?- Testosterone , Free and Total ?- Progesterone ? ?3. Other fatigue ?Post mold treatment, fatigue persists ?- Ferritin ?- B12 and Folate Panel ? ?4. Vitamin D deficiency ?Currently taking 1036mg ?- Vitamin D (25 hydroxy) ? ?5. Vaginal atrophy ?Open to beginning yuvafem for severe atrophy ?- Estradiol (YUVAFEM) 10 MCG TABS vaginal tablet; Place 1 tablet (10 mcg total) vaginally 2  (two) times a week.  Dispense: 24 tablet; Refill: 4 ? ?6. Breast pain, left ?Resolved ?Schedule screening mammo  ? ?A total of 45 mins was spent with this patient. Discussed SBE, colonoscopy and DEXA screening as directed. Recommend 1576ms of exercise weekly, including weight bearing exercise. Encouraged the use of seatbelts and sunscreen.  ?Return in 1 year for annual or sooner prn. ? ?CHKerry DoryHNP-BC, 4:02 PM 10/31/2021  ?

## 2021-11-01 ENCOUNTER — Telehealth: Payer: Self-pay

## 2021-11-01 DIAGNOSIS — Z1211 Encounter for screening for malignant neoplasm of colon: Secondary | ICD-10-CM

## 2021-11-01 LAB — B12 AND FOLATE PANEL
Folate: 23.8 ng/mL
Vitamin B-12: 552 pg/mL (ref 200–1100)

## 2021-11-01 LAB — VITAMIN D 25 HYDROXY (VIT D DEFICIENCY, FRACTURES): Vit D, 25-Hydroxy: 39 ng/mL (ref 30–100)

## 2021-11-01 NOTE — Telephone Encounter (Signed)
Colonoscopy referral ?Received: Yesterday ?Kerry Dory, NP  [facility] Triage ? ?Please refer to GI for screening colonoscopy  ?

## 2021-11-01 NOTE — Telephone Encounter (Signed)
Referral order placed in Epic. 

## 2021-11-03 LAB — FERRITIN: Ferritin: 44 ng/mL (ref 16–232)

## 2021-11-03 LAB — TESTOSTERONE, FREE & TOTAL
Free Testosterone: 2.6 pg/mL (ref 0.1–6.4)
Testosterone, Total, LC-MS-MS: 23 ng/dL (ref 2–45)

## 2021-11-03 LAB — PROGESTERONE: Progesterone: 0.9 ng/mL

## 2021-11-03 LAB — ESTRADIOL: Estradiol: 18 pg/mL

## 2021-11-03 LAB — FOLLICLE STIMULATING HORMONE: FSH: 96.4 m[IU]/mL

## 2021-11-05 LAB — CYTOLOGY - PAP
Comment: NEGATIVE
Diagnosis: UNDETERMINED — AB
High risk HPV: NEGATIVE

## 2021-11-29 ENCOUNTER — Ambulatory Visit: Payer: BC Managed Care – PPO | Admitting: Radiology

## 2021-11-29 ENCOUNTER — Other Ambulatory Visit: Payer: Self-pay | Admitting: Radiology

## 2021-11-29 ENCOUNTER — Ambulatory Visit (INDEPENDENT_AMBULATORY_CARE_PROVIDER_SITE_OTHER): Payer: BC Managed Care – PPO

## 2021-11-29 ENCOUNTER — Other Ambulatory Visit: Payer: Self-pay

## 2021-11-29 DIAGNOSIS — Z780 Asymptomatic menopausal state: Secondary | ICD-10-CM | POA: Diagnosis not present

## 2021-11-29 DIAGNOSIS — Z13820 Encounter for screening for osteoporosis: Secondary | ICD-10-CM

## 2021-11-29 DIAGNOSIS — M810 Age-related osteoporosis without current pathological fracture: Secondary | ICD-10-CM | POA: Diagnosis not present

## 2021-12-01 ENCOUNTER — Ambulatory Visit: Payer: BC Managed Care – PPO | Admitting: Radiology

## 2021-12-01 ENCOUNTER — Encounter: Payer: Self-pay | Admitting: Radiology

## 2021-12-01 VITALS — BP 110/70

## 2021-12-01 DIAGNOSIS — E559 Vitamin D deficiency, unspecified: Secondary | ICD-10-CM

## 2021-12-01 DIAGNOSIS — E538 Deficiency of other specified B group vitamins: Secondary | ICD-10-CM

## 2021-12-01 DIAGNOSIS — N959 Unspecified menopausal and perimenopausal disorder: Secondary | ICD-10-CM

## 2021-12-01 DIAGNOSIS — M810 Age-related osteoporosis without current pathological fracture: Secondary | ICD-10-CM

## 2021-12-01 NOTE — Progress Notes (Signed)
? ?  Stephanie Foley 01/15/1966 [id number] ? ? ?History:  56 y.o. presents for lab and DEXA follow up. DEXA showed osteoporosis with high fracture risk.  Doing well on yuvafem for vaginal atrophy. ? ?Gynecologic History ?Patient's last menstrual period was 12/02/2019 (exact date). ?  ? ? ?Obstetric History ?OB History  ?Gravida Para Term Preterm AB Living  ?'6 3 3   3 3  '$ ?SAB IAB Ectopic Multiple Live Births  ?3       3  ?  ?# Outcome Date GA Lbr Len/2nd Weight Sex Delivery Anes PTL Lv  ?6 SAB           ?5 SAB           ?4 SAB           ?3 Term     F Vag-Spont   LIV  ?2 Term     F Vag-Spont   LIV  ?1 Term     M Vag-Spont   LIV  ? ? ? ?The following portions of the patient's history were reviewed and updated as appropriate: allergies, current medications, past family history, past medical history, past social history, past surgical history, and problem list. ? ?Review of Systems ?Pertinent items noted in HPI and remainder of comprehensive ROS otherwise negative.  ? ?Past medical history, past surgical history, family history and social history were all reviewed and documented in the EPIC chart. ? ? ?Exam: ? ?Vitals:  ? 12/01/21 1447  ?BP: 110/70  ? ?There is no height or weight on file to calculate BMI. ? ? ?Assessment/Plan:   ? ?1. Vitamin D deficiency ?Increase to 5000iu nightly with K2 ?2. B12 deficiency ?Improving slightly. Change to methyl b12 and increase dose to 2049mg  ? ?3. Menopausal and postmenopausal disorder ?Consider HRT to manage symptoms ?Will consider and discuss further when she has more questions ? ?4. Age-related osteoporosis without current pathological fracture ?Baseline DEXA shows osteoporosis, 3 sites with high risk for fracture ?A copy of the report was given to the patient ? ?Discussed treatment options including fosamax, boniva, forteo, evenity and prolia. We discussed the mechanism of action and side effects for these medications. ?Pt will do more research and let me know her  decision. ?Exercise imperative as well. Including weight bearing.   ? ?  ? ? ?CRubbie BattiestB WHNP-BC 3:25 PM 12/01/2021  ?

## 2021-12-14 ENCOUNTER — Encounter: Payer: Self-pay | Admitting: Radiology

## 2021-12-21 NOTE — Telephone Encounter (Signed)
I sent My Chart message to patient and provided the phone number for [facility] so patient can call and schedule.

## 2021-12-21 NOTE — Telephone Encounter (Signed)
[facility] has called the patient twice and left messages.  They also sent a letter for her to call to schedule.

## 2022-01-04 ENCOUNTER — Telehealth: Payer: Self-pay

## 2022-01-04 NOTE — Telephone Encounter (Signed)
I called patient and per DPR access note on file I left detailed message that Jami,NP had asked [place] to place referral to GI for her colonoscopy and we did. [facility] has called her and lmom, sent a letter and a My Chart message. I sent a My Chart message that was returned unread.    I asked her to call the office if this is not something she plans to do and we can remove the referral order.  If it is something she wants to do to please call them to schedule and I provided the phone number.

## 2022-02-20 NOTE — Telephone Encounter (Signed)
Ok to close

## 2022-02-20 NOTE — Telephone Encounter (Signed)
11/02/21 Jennifer left message that referral placed and patient can call to schedule colonoscopy and ph # provided.  11/06/21 [facility] sent a My Chart message to patient to call to schedule.  11/30/21  [facility] called patient and left message to call to schedule.  12/14/21 [facility] mailed letter to patient to call and schedule referral.   Ok to close encounter?

## 2023-11-27 ENCOUNTER — Ambulatory Visit: Admitting: Physical Therapy

## 2024-02-12 ENCOUNTER — Other Ambulatory Visit: Payer: Self-pay | Admitting: *Deleted

## 2024-02-12 DIAGNOSIS — N952 Postmenopausal atrophic vaginitis: Secondary | ICD-10-CM

## 2024-02-12 MED ORDER — ESTRADIOL 10 MCG VA TABS: 1.0000 | ORAL_TABLET | VAGINAL | 0 refills | Status: DC

## 2024-02-12 NOTE — Telephone Encounter (Signed)
 Patient left message on triage line requesting refill of vaginal estrogen until AEX. States she is now out.   Med refill request:estradiol  10 ncg vag tab Last AEX: OV 12/01/21-JC Next AEX: 03/24/24 -JC Last MMG (if hormonal med) 08/31/19- BiRads 1 neg; [facility] MyChart message to patient inquiring about updated MMG  Refill authorized: Please Advise?

## 2024-03-24 ENCOUNTER — Ambulatory Visit: Payer: Self-pay | Admitting: Radiology

## 2024-03-25 ENCOUNTER — Ambulatory Visit: Payer: Self-pay | Admitting: Radiology

## 2024-05-14 ENCOUNTER — Ambulatory Visit: Admitting: Radiology

## 2024-06-19 ENCOUNTER — Telehealth: Payer: Self-pay | Admitting: *Deleted

## 2024-06-19 DIAGNOSIS — N952 Postmenopausal atrophic vaginitis: Secondary | ICD-10-CM

## 2024-06-19 MED ORDER — ESTRADIOL 10 MCG VA TABS: 1.0000 | ORAL_TABLET | VAGINAL | 0 refills | Status: AC

## 2024-06-19 NOTE — Telephone Encounter (Signed)
 Rx for vaginal estradiol  sent for 36mo supply, 0RF to pharmacy on file.   Call placed to patient, no answer, no voicemail.

## 2024-06-19 NOTE — Telephone Encounter (Signed)
 Med refill request: estradiol  10 mcg vaginal tablet twice weekly for vaginal atrophy Last AEX: 10/11/2021 -JC Next AEX: 09/08/2024 -JC Last MMG (if hormonal med) [facility] 08/31/2019, Birads 1 neg  Spoke with patient, states she was scheduled for 05/14/24 and had to cancel due to illness.   Patient is uncertain if she has had an updated MMG, she will contact [facility] to schedule.   Patient never returned to discuss 11/29/21 BMD results, patient asking if BMD can also be repeated.   Advised I will forward to Jami to review and our office will f/u. Patient agreeable.

## 2024-06-19 NOTE — Telephone Encounter (Signed)
 Ok to send for repeat DEXA. She can have this done at [facility] or a [facility] facility that is convenient for her. Ok to refill estradiol  since her AEX is scheduled.

## 2024-06-23 NOTE — Telephone Encounter (Signed)
 Called patient to give her Jami's recommendations regarding bone scan. No answer or voicemail set up.

## 2024-06-23 NOTE — Telephone Encounter (Signed)
 This is Dr. Nikki covering for [facility].   We need updated mammogram for patient.

## 2024-07-14 NOTE — Telephone Encounter (Signed)
 Call placed to [facility]. Patient cancelled her screening MMG scheduled for June 2025, has not rescheduled.   No response from patient. Patient is scheduled for AEX 09/08/24.  Letter sent via MyChart and copy to Jami to sign to be mailed.   Routing FYI.   Cc: Dr. Nikki

## 2024-09-08 ENCOUNTER — Encounter: Payer: Self-pay | Admitting: Radiology

## 2024-09-08 ENCOUNTER — Ambulatory Visit: Admitting: Radiology

## 2024-09-08 VITALS — BP 116/72 | HR 76 | Ht 62.25 in | Wt 115.0 lb

## 2024-09-08 DIAGNOSIS — Z1331 Encounter for screening for depression: Secondary | ICD-10-CM | POA: Diagnosis not present

## 2024-09-08 DIAGNOSIS — M810 Age-related osteoporosis without current pathological fracture: Secondary | ICD-10-CM | POA: Diagnosis not present

## 2024-09-08 DIAGNOSIS — Z01419 Encounter for gynecological examination (general) (routine) without abnormal findings: Secondary | ICD-10-CM

## 2024-09-08 NOTE — Progress Notes (Signed)
" ° °  AALIYHA MUMFORD 1965-08-14 [id number]   History: Postmenopausal 59 y.o. presents for annual exam. 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 Using vaginal estradiol  twice weekly, does not need refills.    Gynecologic History Postmenopausal Last Pap: 3/23. Results were: ASCUS HPV neg Last mammogram: 2021. Results were: normal Last colonoscopy: never DEXA: 2023 osteoporosis  Obstetric History OB History  Gravida Para Term Preterm AB Living  6 3 3  3 3   SAB IAB Ectopic Multiple Live Births  3    3    # Outcome Date GA Lbr Len/2nd Weight Sex Type Anes PTL Lv  6 SAB           5 SAB           4 SAB           3 Term     F Vag-Spont   LIV  2 Term     F Vag-Spont   LIV  1 Term     M Vag-Spont   LIV       09/08/2024    4:09 PM 01/01/2020    3:42 PM  Depression screen PHQ 2/9  Decreased Interest 0 0  Down, Depressed, Hopeless 0 0  PHQ - 2 Score 0 0     The following portions of the patient's history were reviewed and updated as appropriate: allergies, current medications, past family history, past medical history, past social history, past surgical history, and problem list.  Review of Systems Pertinent items noted in HPI and remainder of comprehensive ROS otherwise negative.  Past medical history, past surgical history, family history and social history were all reviewed and documented in the EPIC chart.  Exam:  Vitals:   09/08/24 1607  BP: 116/72  Pulse: 76  SpO2: 99%  Weight: 115 lb (52.2 kg)  Height: 5' 2.25 (1.581 m)   Body mass index is 20.87 kg/m.  General appearance:  Normal Thyroid :  Symmetrical, normal in size, without palpable masses or nodularity. Respiratory  Auscultation:  Clear without wheezing or rhonchi Cardiovascular  Auscultation:  Regular rate, without rubs, murmurs or gallops  Edema/varicosities:  Not grossly evident Abdominal  Soft,nontender, without masses, guarding or rebound.  Liver/spleen:  No  organomegaly noted  Hernia:  None appreciated  Skin  Inspection:  Grossly normal Breasts: Examined lying and sitting.   Right: Without masses, retractions, nipple discharge or axillary adenopathy.   Left: Without masses, retractions, nipple discharge or axillary adenopathy. Genitourinary   Inguinal/mons:  Normal without inguinal adenopathy  External genitalia:  Normal appearing vulva with no masses, tenderness, or lesions  BUS/Urethra/Skene's glands:  Normal  Vagina:  Normal appearing with normal color and discharge, no lesions. Atrophy: mild   Cervix:  Normal appearing without discharge or lesions  Uterus:  Normal in size, shape and contour.  Midline and mobile, nontender  Adnexa/parametria:     Rt: Normal in size, without masses or tenderness.   Lt: Normal in size, without masses or tenderness.  Anus and perineum: Normal    Darice Hoit, CMA present for exam  Assessment/Plan:   1. Well woman exam with routine gynecological exam (Primary) - Cytology - PAP( [place]) - Labs with PCP  2. Menopausal osteoporosis DEXA ordered with mammo at [facility]  3. Depression screen    Return in 1 year for annual or sooner prn.  Derrian Rodak B WHNP-BC, 4:28 PM 09/08/2024 "

## 2024-09-10 ENCOUNTER — Ambulatory Visit: Payer: Self-pay | Admitting: Radiology

## 2024-09-10 LAB — CYTOLOGY - PAP
Comment: NEGATIVE
Diagnosis: NEGATIVE
High risk HPV: NEGATIVE
